# Patient Record
Sex: Female | Born: 1935 | Race: Black or African American | Hispanic: No | Marital: Married | Smoking: Never smoker
Health system: Southern US, Community
[De-identification: ages and names within clinical notes are randomized; demographics above are authoritative.]

## PROBLEM LIST (undated history)

## (undated) DIAGNOSIS — F32A Depression, unspecified: Secondary | ICD-10-CM

## (undated) DIAGNOSIS — E079 Disorder of thyroid, unspecified: Secondary | ICD-10-CM

## (undated) DIAGNOSIS — I10 Essential (primary) hypertension: Secondary | ICD-10-CM

## (undated) DIAGNOSIS — I2510 Atherosclerotic heart disease of native coronary artery without angina pectoris: Secondary | ICD-10-CM

## (undated) DIAGNOSIS — K219 Gastro-esophageal reflux disease without esophagitis: Secondary | ICD-10-CM

## (undated) DIAGNOSIS — F0280 Dementia in other diseases classified elsewhere without behavioral disturbance: Secondary | ICD-10-CM

## (undated) HISTORY — PX: FRACTURE SURGERY: SHX138

## (undated) HISTORY — DX: Atherosclerotic heart disease of native coronary artery without angina pectoris: I25.10

## (undated) HISTORY — PX: ABDOMINAL HYSTERECTOMY: SHX81

---

## 2009-05-06 ENCOUNTER — Emergency Department (HOSPITAL_BASED_OUTPATIENT_CLINIC_OR_DEPARTMENT_OTHER): Admission: EM | Admit: 2009-05-06 | Discharge: 2009-05-07 | Payer: Self-pay | Admitting: Emergency Medicine

## 2009-05-07 ENCOUNTER — Ambulatory Visit: Payer: Self-pay | Admitting: Diagnostic Radiology

## 2014-10-16 ENCOUNTER — Encounter (HOSPITAL_BASED_OUTPATIENT_CLINIC_OR_DEPARTMENT_OTHER): Payer: Self-pay

## 2014-10-16 ENCOUNTER — Emergency Department (HOSPITAL_BASED_OUTPATIENT_CLINIC_OR_DEPARTMENT_OTHER): Payer: Medicare HMO

## 2014-10-16 ENCOUNTER — Emergency Department (HOSPITAL_BASED_OUTPATIENT_CLINIC_OR_DEPARTMENT_OTHER)
Admission: EM | Admit: 2014-10-16 | Discharge: 2014-10-16 | Disposition: A | Discharge status: Home / Self Care | Payer: Medicare HMO | Attending: Emergency Medicine | Admitting: Emergency Medicine

## 2014-10-16 DIAGNOSIS — Z880 Allergy status to penicillin: Secondary | ICD-10-CM | POA: Diagnosis not present

## 2014-10-16 DIAGNOSIS — R197 Diarrhea, unspecified: Secondary | ICD-10-CM | POA: Insufficient documentation

## 2014-10-16 DIAGNOSIS — Z792 Long term (current) use of antibiotics: Secondary | ICD-10-CM | POA: Diagnosis not present

## 2014-10-16 DIAGNOSIS — Z8709 Personal history of other diseases of the respiratory system: Secondary | ICD-10-CM | POA: Insufficient documentation

## 2014-10-16 DIAGNOSIS — Z87440 Personal history of urinary (tract) infections: Secondary | ICD-10-CM | POA: Insufficient documentation

## 2014-10-16 DIAGNOSIS — R5383 Other fatigue: Secondary | ICD-10-CM | POA: Insufficient documentation

## 2014-10-16 DIAGNOSIS — Z791 Long term (current) use of non-steroidal anti-inflammatories (NSAID): Secondary | ICD-10-CM | POA: Diagnosis not present

## 2014-10-16 DIAGNOSIS — R531 Weakness: Secondary | ICD-10-CM | POA: Insufficient documentation

## 2014-10-16 DIAGNOSIS — Z79899 Other long term (current) drug therapy: Secondary | ICD-10-CM | POA: Diagnosis not present

## 2014-10-16 LAB — URINALYSIS, ROUTINE W REFLEX MICROSCOPIC
BILIRUBIN URINE: NEGATIVE
GLUCOSE, UA: NEGATIVE mg/dL
Hgb urine dipstick: NEGATIVE
KETONES UR: NEGATIVE mg/dL
Leukocytes, UA: NEGATIVE
Nitrite: NEGATIVE
Protein, ur: NEGATIVE mg/dL
SPECIFIC GRAVITY, URINE: 1.01 (ref 1.005–1.030)
UROBILINOGEN UA: 0.2 mg/dL (ref 0.0–1.0)
pH: 6 (ref 5.0–8.0)

## 2014-10-16 LAB — CBC WITH DIFFERENTIAL/PLATELET
Basophils Absolute: 0 10*3/uL (ref 0.0–0.1)
Basophils Relative: 1 % (ref 0–1)
EOS ABS: 0.2 10*3/uL (ref 0.0–0.7)
EOS PCT: 3 % (ref 0–5)
HCT: 35.1 % — ABNORMAL LOW (ref 36.0–46.0)
Hemoglobin: 11.5 g/dL — ABNORMAL LOW (ref 12.0–15.0)
LYMPHS ABS: 1.9 10*3/uL (ref 0.7–4.0)
Lymphocytes Relative: 25 % (ref 12–46)
MCH: 27.8 pg (ref 26.0–34.0)
MCHC: 32.8 g/dL (ref 30.0–36.0)
MCV: 85 fL (ref 78.0–100.0)
Monocytes Absolute: 1.3 10*3/uL — ABNORMAL HIGH (ref 0.1–1.0)
Monocytes Relative: 17 % — ABNORMAL HIGH (ref 3–12)
NEUTROS ABS: 4.1 10*3/uL (ref 1.7–7.7)
NEUTROS PCT: 54 % (ref 43–77)
PLATELETS: 191 10*3/uL (ref 150–400)
RBC: 4.13 MIL/uL (ref 3.87–5.11)
RDW: 13.5 % (ref 11.5–15.5)
WBC: 7.5 10*3/uL (ref 4.0–10.5)

## 2014-10-16 LAB — COMPREHENSIVE METABOLIC PANEL
ALBUMIN: 3.3 g/dL — AB (ref 3.5–5.0)
ALT: 17 U/L (ref 14–54)
AST: 33 U/L (ref 15–41)
Alkaline Phosphatase: 73 U/L (ref 38–126)
Anion gap: 10 (ref 5–15)
BUN: 17 mg/dL (ref 6–20)
CO2: 21 mmol/L — ABNORMAL LOW (ref 22–32)
Calcium: 9.6 mg/dL (ref 8.9–10.3)
Chloride: 103 mmol/L (ref 101–111)
Creatinine, Ser: 1.08 mg/dL — ABNORMAL HIGH (ref 0.44–1.00)
GFR calc non Af Amer: 47 mL/min — ABNORMAL LOW (ref >60)
GFR, EST AFRICAN AMERICAN: 55 mL/min — AB (ref >60)
Glucose, Bld: 119 mg/dL — ABNORMAL HIGH (ref 65–99)
POTASSIUM: 3.9 mmol/L (ref 3.5–5.1)
SODIUM: 134 mmol/L — AB (ref 135–145)
TOTAL PROTEIN: 7.6 g/dL (ref 6.5–8.1)
Total Bilirubin: 0.4 mg/dL (ref 0.3–1.2)

## 2014-10-16 LAB — TSH: TSH: 1.705 u[IU]/mL (ref 0.350–4.500)

## 2014-10-16 MED ORDER — SODIUM CHLORIDE 0.9 % IV BOLUS (SEPSIS)
500.0000 mL | Freq: Once | INTRAVENOUS | Status: AC
  Administered 2014-10-16: 500 mL via INTRAVENOUS

## 2014-10-16 NOTE — ED Notes (Signed)
Pt reports nausea and weakness for a few week. Sts she was [facility] 2 weeks [facility]. Seen at [facility] since, dx with URI, given prednisone, "hasn't gotten better."

## 2014-10-16 NOTE — Discharge Instructions (Signed)

## 2014-10-16 NOTE — ED Provider Notes (Signed)
CSN: [id number]  [id number] date & time 10/16/14  1541 History   First MD Initiated Contact with Patient 10/16/14 1554     Chief Complaint  Patient presents with  . Weakness     (Consider location/radiation/quality/duration/timing/severity/associated sxs/prior Treatment) Patient is a 81 y.o. female presenting with weakness. The history is provided by the patient.  Weakness Pertinent negatives include no chest pain, no abdominal pain, no headaches and no shortness of breath.   patient has had generalized weakness for the last month. States she was treated for a bronchitis/pneumonia and had been on antibodies. She then became constipated and had to be disimpacted at [facility]. She then followed up with her doctor was started on new N biotics. States she is allergic to most medicines. No fevers. States she's now having some diarrhea. States she feels overall weak and her husband has helped around the house. No chest pain. She has a mild cough. She states she feels primarily weak in her legs. No fevers or chills. States around a month ago her thyroid medicine was decreased also.  No past medical history on file. Past Surgical History  Procedure Laterality Date  . Abdominal hysterectomy    . Fracture surgery     No family history on file. History  Substance Use Topics  . Smoking status: Never Smoker   . Smokeless tobacco: Not on file  . Alcohol Use: No   OB History    No data available     Review of Systems  Constitutional: Positive for fatigue. Negative for activity change and appetite change.  Eyes: Negative for pain.  Respiratory: Negative for chest tightness and shortness of breath.   Cardiovascular: Negative for chest pain and leg swelling.  Gastrointestinal: Positive for diarrhea. Negative for nausea, vomiting and abdominal pain.  Genitourinary: Negative for flank pain.  Musculoskeletal: Negative for back pain and neck stiffness.  Skin: Negative for rash.   Neurological: Positive for weakness. Negative for numbness and headaches.  Psychiatric/Behavioral: Negative for behavioral problems.      Allergies  Actonel; Amlodipine; Codeine; Flexeril; Iodine; Lipitor; Losartan potassium-hctz; Penicillins; Singulair; Sulfa antibiotics; and Zafirlukast  Home Medications   Prior to Admission medications   Medication Sig Start Date End Date Taking? Authorizing Provider  azithromycin (ZITHROMAX) 250 MG tablet Take by mouth daily.   Yes Historical Provider, MD  ciclopirox (PENLAC) 8 % solution Apply topically at bedtime. Apply over nail and surrounding skin. Apply daily over previous coat. After seven (7) days, may remove with alcohol and continue cycle.   Yes Historical Provider, MD  DiltiaZEM HCl Coated Beads (CARTIA XT PO) Take by mouth.   Yes Historical Provider, MD  ezetimibe (ZETIA) 10 MG tablet Take 10 mg by mouth daily.   Yes Historical Provider, MD  fluconazole (DIFLUCAN) 150 MG tablet Take 150 mg by mouth daily.   Yes Historical Provider, MD  furosemide (LASIX) 40 MG tablet Take 40 mg by mouth.   Yes Historical Provider, MD  Ipratropium Bromide (ATROVENT IN) Inhale into the lungs.   Yes Historical Provider, MD  levothyroxine (SYNTHROID, LEVOTHROID) 50 MCG tablet Take 50 mcg by mouth daily before breakfast.   Yes Historical Provider, MD  metFORMIN (GLUCOPHAGE) 500 MG tablet Take by mouth 2 (two) times daily with a meal.   Yes Historical Provider, MD  montelukast (SINGULAIR) 10 MG tablet Take 10 mg by mouth at bedtime.   Yes Historical Provider, MD  mupirocin ointment (BACTROBAN) 2 % Place 1 application into  the nose 2 (two) times daily.   Yes Historical Provider, MD  naproxen (NAPROSYN) 500 MG tablet Take 500 mg by mouth 2 (two) times daily with a meal.   Yes Historical Provider, MD  nitrofurantoin (MACRODANTIN) 100 MG capsule Take 100 mg by mouth 4 (four) times daily.   Yes Historical Provider, MD  omeprazole (PRILOSEC) 40 MG capsule Take 40 mg  by mouth daily.   Yes Historical Provider, MD  promethazine-dextromethorphan (PROMETHAZINE-DM) 6.25-15 MG/5ML syrup Take by mouth 4 (four) times daily as needed for cough.   Yes Historical Provider, MD   BP 182/99 mmHg  Pulse 67  Temp(Src) 98.2 F (36.8 C) (Oral)  Resp 16  Ht 5\' 3"  (1.6 m)  Wt 167 lb (75.751 kg)  BMI 29.59 kg/m2  SpO2 100% Physical Exam  Constitutional: She is oriented to person, place, and time. She appears well-developed and well-nourished.  HENT:  Head: Normocephalic and atraumatic.  Eyes: Pupils are equal, round, and reactive to light.  Neck: Normal range of motion. Neck supple.  Cardiovascular: Normal rate, regular rhythm and normal heart sounds.   No murmur heard. Pulmonary/Chest: Effort normal and breath sounds normal. No respiratory distress. She has no wheezes. She has no rales.  Abdominal: Soft. Bowel sounds are normal. She exhibits no distension. There is no tenderness.  Musculoskeletal: Normal range of motion.  Neurological: She is alert and oriented to person, place, and time. No cranial nerve deficit.  Skin: Skin is warm and dry.  Psychiatric: Her speech is normal.  Nursing note and vitals reviewed.   ED Course  Procedures (including critical care time) Labs Review Labs Reviewed  COMPREHENSIVE METABOLIC PANEL - Abnormal; Notable for the following:    Sodium 134 (*)    CO2 21 (*)    Glucose, Bld 119 (*)    Creatinine, Ser 1.08 (*)    Albumin 3.3 (*)    GFR calc non Af Amer 47 (*)    GFR calc Af Amer 55 (*)    All other components within normal limits  CBC WITH DIFFERENTIAL/PLATELET - Abnormal; Notable for the following:    Hemoglobin 11.5 (*)    HCT 35.1 (*)    Monocytes Relative 17 (*)    Monocytes Absolute 1.3 (*)    All other components within normal limits  URINALYSIS, ROUTINE W REFLEX MICROSCOPIC (NOT AT [facility])  TSH    Imaging Review Dg Chest 2 View  10/16/2014   CLINICAL DATA:  Nausea, weakness for a week  EXAM: CHEST  2 VIEW   COMPARISON:  10/11/2014  FINDINGS: Cardiomediastinal silhouette is unremarkable. Stable scarring in right upper lobe. No acute infiltrate or pleural effusion. No pulmonary edema. Degenerative changes thoracic spine.  IMPRESSION: No active cardiopulmonary disease. Stable scarring in right upper lobe.   Electronically Signed   By: Natasha MeadLiviu  Pop M.D.   On: 10/16/2014 16:28     EKG Interpretation None      MDM   Final diagnoses:  Generalized weakness    Patient generalized weakness for the last month. Has had UTI and has been treated for pulmonary infection also. She states that her calcium has been low in the past is not low now. Urinalysis appears to cleared up. Cultures reviewed from outside hospital. TSH pending but is not resulted yet will be discharged home and will be followed up with her primary care doctor. Feels better after some IV fluid.    Benjiman CoreNathan Yesmin Mutch, MD 10/16/14 1816

## 2014-10-16 NOTE — ED Notes (Signed)
Reports URI  Recently, was seen by pvt MD>better.  Had some leg weakness last week and fell >pain rt hip.  Is able to stand,slowly, and able to walk.  Denies loss of consciousness  Alert and oriented

## 2019-06-01 ENCOUNTER — Ambulatory Visit: Payer: Medicare HMO | Attending: Internal Medicine

## 2019-06-01 DIAGNOSIS — Z23 Encounter for immunization: Secondary | ICD-10-CM | POA: Insufficient documentation

## 2019-06-01 NOTE — Progress Notes (Signed)
   Covid-19 Vaccination Clinic  Name:  Andrea Warner    MRN: [id number] DOB: [birth year]  06/01/2019  [name] was observed post Covid-19 immunization for 15 minutes without incident. She was provided with Vaccine Information Sheet and instruction to access the V-Safe system.   [name] was instructed to call 911 with any severe reactions post vaccine: Marland Kitchen Difficulty breathing  . Swelling of face and throat  . A fast heartbeat  . A bad rash all over body  . Dizziness and weakness   Immunizations Administered    Name Date Dose VIS Date Route   Pfizer COVID-19 Vaccine 06/01/2019  5:30 PM 0.3 mL 03/10/2019 Intramuscular   Manufacturer: [employer], [employer]   Lot: [id number]   NDC: [address]-0

## 2019-06-28 ENCOUNTER — Ambulatory Visit: Payer: Medicare HMO | Attending: Internal Medicine

## 2019-06-28 DIAGNOSIS — Z23 Encounter for immunization: Secondary | ICD-10-CM

## 2019-06-28 NOTE — Progress Notes (Signed)
   Covid-19 Vaccination Clinic  Name:  Sophiamarie Nease    MRN: [id number] DOB: December 15, 1931  06/28/2019  Ms. Tabares was observed post Covid-19 immunization for 15 minutes without incident. She was provided with Vaccine Information Sheet and instruction to access the V-Safe system.   Ms. Criado was instructed to call 911 with any severe reactions post vaccine: Marland Kitchen Difficulty breathing  . Swelling of face and throat  . A fast heartbeat  . A bad rash all over body  . Dizziness and weakness   Immunizations Administered    Name Date Dose VIS Date Route   Pfizer COVID-19 Vaccine 06/28/2019  4:29 PM 0.3 mL 03/10/2019 Intramuscular   Manufacturer: [employer], [employer]   Lot: [id number]   NDC: [address]-1

## 2019-09-16 ENCOUNTER — Inpatient Hospital Stay (HOSPITAL_BASED_OUTPATIENT_CLINIC_OR_DEPARTMENT_OTHER)
Admission: EM | Admit: 2019-09-16 | Discharge: 2019-09-19 | DRG: 069 | Disposition: A | Discharge status: Home Health | Payer: Medicare HMO | Attending: Internal Medicine | Admitting: Internal Medicine

## 2019-09-16 ENCOUNTER — Emergency Department (HOSPITAL_BASED_OUTPATIENT_CLINIC_OR_DEPARTMENT_OTHER): Payer: Medicare HMO

## 2019-09-16 ENCOUNTER — Encounter (HOSPITAL_BASED_OUTPATIENT_CLINIC_OR_DEPARTMENT_OTHER): Payer: Self-pay | Admitting: Emergency Medicine

## 2019-09-16 DIAGNOSIS — R8281 Pyuria: Secondary | ICD-10-CM | POA: Diagnosis present

## 2019-09-16 DIAGNOSIS — R471 Dysarthria and anarthria: Secondary | ICD-10-CM | POA: Diagnosis present

## 2019-09-16 DIAGNOSIS — R8271 Bacteriuria: Secondary | ICD-10-CM | POA: Diagnosis present

## 2019-09-16 DIAGNOSIS — Z880 Allergy status to penicillin: Secondary | ICD-10-CM

## 2019-09-16 DIAGNOSIS — R4182 Altered mental status, unspecified: Secondary | ICD-10-CM | POA: Diagnosis not present

## 2019-09-16 DIAGNOSIS — R419 Unspecified symptoms and signs involving cognitive functions and awareness: Secondary | ICD-10-CM | POA: Diagnosis present

## 2019-09-16 DIAGNOSIS — Z8249 Family history of ischemic heart disease and other diseases of the circulatory system: Secondary | ICD-10-CM

## 2019-09-16 DIAGNOSIS — E1159 Type 2 diabetes mellitus with other circulatory complications: Secondary | ICD-10-CM

## 2019-09-16 DIAGNOSIS — Z66 Do not resuscitate: Secondary | ICD-10-CM | POA: Diagnosis present

## 2019-09-16 DIAGNOSIS — R4701 Aphasia: Secondary | ICD-10-CM | POA: Diagnosis present

## 2019-09-16 DIAGNOSIS — Z91041 Radiographic dye allergy status: Secondary | ICD-10-CM

## 2019-09-16 DIAGNOSIS — E039 Hypothyroidism, unspecified: Secondary | ICD-10-CM

## 2019-09-16 DIAGNOSIS — R262 Difficulty in walking, not elsewhere classified: Secondary | ICD-10-CM | POA: Diagnosis not present

## 2019-09-16 DIAGNOSIS — G8324 Monoplegia of upper limb affecting left nondominant side: Secondary | ICD-10-CM | POA: Diagnosis present

## 2019-09-16 DIAGNOSIS — R4702 Dysphasia: Secondary | ICD-10-CM | POA: Diagnosis present

## 2019-09-16 DIAGNOSIS — M6281 Muscle weakness (generalized): Secondary | ICD-10-CM | POA: Diagnosis not present

## 2019-09-16 DIAGNOSIS — I959 Hypotension, unspecified: Secondary | ICD-10-CM | POA: Diagnosis not present

## 2019-09-16 DIAGNOSIS — N179 Acute kidney failure, unspecified: Secondary | ICD-10-CM

## 2019-09-16 DIAGNOSIS — F329 Major depressive disorder, single episode, unspecified: Secondary | ICD-10-CM | POA: Diagnosis present

## 2019-09-16 DIAGNOSIS — R29701 NIHSS score 1: Secondary | ICD-10-CM | POA: Diagnosis present

## 2019-09-16 DIAGNOSIS — G309 Alzheimer's disease, unspecified: Secondary | ICD-10-CM | POA: Diagnosis present

## 2019-09-16 DIAGNOSIS — I351 Nonrheumatic aortic (valve) insufficiency: Secondary | ICD-10-CM | POA: Diagnosis not present

## 2019-09-16 DIAGNOSIS — R9431 Abnormal electrocardiogram [ECG] [EKG]: Secondary | ICD-10-CM | POA: Diagnosis not present

## 2019-09-16 DIAGNOSIS — E119 Type 2 diabetes mellitus without complications: Secondary | ICD-10-CM | POA: Diagnosis not present

## 2019-09-16 DIAGNOSIS — I5040 Unspecified combined systolic (congestive) and diastolic (congestive) heart failure: Secondary | ICD-10-CM | POA: Diagnosis present

## 2019-09-16 DIAGNOSIS — Z79890 Hormone replacement therapy (postmenopausal): Secondary | ICD-10-CM

## 2019-09-16 DIAGNOSIS — I639 Cerebral infarction, unspecified: Secondary | ICD-10-CM

## 2019-09-16 DIAGNOSIS — I130 Hypertensive heart and chronic kidney disease with heart failure and stage 1 through stage 4 chronic kidney disease, or unspecified chronic kidney disease: Secondary | ICD-10-CM | POA: Diagnosis not present

## 2019-09-16 DIAGNOSIS — G459 Transient cerebral ischemic attack, unspecified: Principal | ICD-10-CM | POA: Diagnosis present

## 2019-09-16 DIAGNOSIS — Z23 Encounter for immunization: Secondary | ICD-10-CM | POA: Diagnosis not present

## 2019-09-16 DIAGNOSIS — Z885 Allergy status to narcotic agent status: Secondary | ICD-10-CM

## 2019-09-16 DIAGNOSIS — F0280 Dementia in other diseases classified elsewhere without behavioral disturbance: Secondary | ICD-10-CM

## 2019-09-16 DIAGNOSIS — I152 Hypertension secondary to endocrine disorders: Secondary | ICD-10-CM | POA: Diagnosis present

## 2019-09-16 DIAGNOSIS — D649 Anemia, unspecified: Secondary | ICD-10-CM

## 2019-09-16 DIAGNOSIS — N390 Urinary tract infection, site not specified: Secondary | ICD-10-CM | POA: Diagnosis not present

## 2019-09-16 DIAGNOSIS — Z882 Allergy status to sulfonamides status: Secondary | ICD-10-CM

## 2019-09-16 DIAGNOSIS — D72829 Elevated white blood cell count, unspecified: Secondary | ICD-10-CM | POA: Diagnosis not present

## 2019-09-16 DIAGNOSIS — I340 Nonrheumatic mitral (valve) insufficiency: Secondary | ICD-10-CM | POA: Diagnosis not present

## 2019-09-16 DIAGNOSIS — Z79899 Other long term (current) drug therapy: Secondary | ICD-10-CM | POA: Diagnosis not present

## 2019-09-16 DIAGNOSIS — Z7984 Long term (current) use of oral hypoglycemic drugs: Secondary | ICD-10-CM

## 2019-09-16 DIAGNOSIS — R778 Other specified abnormalities of plasma proteins: Secondary | ICD-10-CM | POA: Diagnosis not present

## 2019-09-16 DIAGNOSIS — Z90710 Acquired absence of both cervix and uterus: Secondary | ICD-10-CM

## 2019-09-16 DIAGNOSIS — Z20822 Contact with and (suspected) exposure to covid-19: Secondary | ICD-10-CM | POA: Diagnosis present

## 2019-09-16 DIAGNOSIS — E1169 Type 2 diabetes mellitus with other specified complication: Secondary | ICD-10-CM | POA: Diagnosis present

## 2019-09-16 DIAGNOSIS — R4189 Other symptoms and signs involving cognitive functions and awareness: Secondary | ICD-10-CM | POA: Diagnosis not present

## 2019-09-16 DIAGNOSIS — Z7982 Long term (current) use of aspirin: Secondary | ICD-10-CM | POA: Diagnosis not present

## 2019-09-16 DIAGNOSIS — Z888 Allergy status to other drugs, medicaments and biological substances status: Secondary | ICD-10-CM

## 2019-09-16 DIAGNOSIS — E1122 Type 2 diabetes mellitus with diabetic chronic kidney disease: Secondary | ICD-10-CM | POA: Diagnosis not present

## 2019-09-16 DIAGNOSIS — I6782 Cerebral ischemia: Secondary | ICD-10-CM | POA: Diagnosis not present

## 2019-09-16 DIAGNOSIS — R4781 Slurred speech: Secondary | ICD-10-CM | POA: Diagnosis not present

## 2019-09-16 DIAGNOSIS — R001 Bradycardia, unspecified: Secondary | ICD-10-CM | POA: Diagnosis present

## 2019-09-16 DIAGNOSIS — I6340 Cerebral infarction due to embolism of unspecified cerebral artery: Secondary | ICD-10-CM | POA: Insufficient documentation

## 2019-09-16 DIAGNOSIS — E860 Dehydration: Secondary | ICD-10-CM | POA: Diagnosis present

## 2019-09-16 DIAGNOSIS — I509 Heart failure, unspecified: Secondary | ICD-10-CM | POA: Diagnosis not present

## 2019-09-16 DIAGNOSIS — N183 Chronic kidney disease, stage 3 unspecified: Secondary | ICD-10-CM | POA: Diagnosis not present

## 2019-09-16 DIAGNOSIS — E785 Hyperlipidemia, unspecified: Secondary | ICD-10-CM | POA: Diagnosis present

## 2019-09-16 DIAGNOSIS — K219 Gastro-esophageal reflux disease without esophagitis: Secondary | ICD-10-CM | POA: Diagnosis present

## 2019-09-16 HISTORY — DX: Depression, unspecified: F32.A

## 2019-09-16 HISTORY — DX: Dementia in other diseases classified elsewhere, unspecified severity, without behavioral disturbance, psychotic disturbance, mood disturbance, and anxiety: F02.80

## 2019-09-16 HISTORY — DX: Gastro-esophageal reflux disease without esophagitis: K21.9

## 2019-09-16 HISTORY — DX: Disorder of thyroid, unspecified: E07.9

## 2019-09-16 HISTORY — DX: Essential (primary) hypertension: I10

## 2019-09-16 LAB — CBC WITH DIFFERENTIAL/PLATELET
Abs Immature Granulocytes: 0.7 10*3/uL — ABNORMAL HIGH (ref 0.00–0.07)
Basophils Absolute: 0.2 10*3/uL — ABNORMAL HIGH (ref 0.0–0.1)
Basophils Relative: 2 %
Eosinophils Absolute: 0.6 10*3/uL — ABNORMAL HIGH (ref 0.0–0.5)
Eosinophils Relative: 6 %
HCT: 28.1 % — ABNORMAL LOW (ref 36.0–46.0)
Hemoglobin: 9.1 g/dL — ABNORMAL LOW (ref 12.0–15.0)
Immature Granulocytes: 7 %
Lymphocytes Relative: 18 %
Lymphs Abs: 1.9 10*3/uL (ref 0.7–4.0)
MCH: 28.5 pg (ref 26.0–34.0)
MCHC: 32.4 g/dL (ref 30.0–36.0)
MCV: 88.1 fL (ref 80.0–100.0)
Monocytes Absolute: 1.2 10*3/uL — ABNORMAL HIGH (ref 0.1–1.0)
Monocytes Relative: 12 %
Neutro Abs: 5.8 10*3/uL (ref 1.7–7.7)
Neutrophils Relative %: 55 %
Platelets: 224 10*3/uL (ref 150–400)
RBC: 3.19 MIL/uL — ABNORMAL LOW (ref 3.87–5.11)
RDW: 15.5 % (ref 11.5–15.5)
Smear Review: NORMAL
WBC: 10.5 10*3/uL (ref 4.0–10.5)
nRBC: 0.2 % (ref 0.0–0.2)

## 2019-09-16 LAB — COMPREHENSIVE METABOLIC PANEL
ALT: 10 U/L (ref 0–44)
AST: 20 U/L (ref 15–41)
Albumin: 3 g/dL — ABNORMAL LOW (ref 3.5–5.0)
Alkaline Phosphatase: 62 U/L (ref 38–126)
Anion gap: 10 (ref 5–15)
BUN: 22 mg/dL (ref 8–23)
CO2: 22 mmol/L (ref 22–32)
Calcium: 9.1 mg/dL (ref 8.9–10.3)
Chloride: 101 mmol/L (ref 98–111)
Creatinine, Ser: 1.73 mg/dL — ABNORMAL HIGH (ref 0.44–1.00)
GFR calc Af Amer: 31 mL/min — ABNORMAL LOW (ref >60)
GFR calc non Af Amer: 26 mL/min — ABNORMAL LOW (ref >60)
Glucose, Bld: 101 mg/dL — ABNORMAL HIGH (ref 70–99)
Potassium: 3.6 mmol/L (ref 3.5–5.1)
Sodium: 133 mmol/L — ABNORMAL LOW (ref 135–145)
Total Bilirubin: 0.7 mg/dL (ref 0.3–1.2)
Total Protein: 7.1 g/dL (ref 6.5–8.1)

## 2019-09-16 LAB — URINALYSIS, ROUTINE W REFLEX MICROSCOPIC
Bilirubin Urine: NEGATIVE
Glucose, UA: NEGATIVE mg/dL
Hgb urine dipstick: NEGATIVE
Ketones, ur: NEGATIVE mg/dL
Nitrite: NEGATIVE
Protein, ur: NEGATIVE mg/dL
Specific Gravity, Urine: 1.015 (ref 1.005–1.030)
pH: 6 (ref 5.0–8.0)

## 2019-09-16 LAB — CBG MONITORING, ED: Glucose-Capillary: 90 mg/dL (ref 70–99)

## 2019-09-16 LAB — RAPID URINE DRUG SCREEN, HOSP PERFORMED
Amphetamines: NOT DETECTED
Barbiturates: NOT DETECTED
Benzodiazepines: NOT DETECTED
Cocaine: NOT DETECTED
Opiates: NOT DETECTED
Tetrahydrocannabinol: NOT DETECTED

## 2019-09-16 LAB — SARS CORONAVIRUS 2 BY RT PCR (HOSPITAL ORDER, PERFORMED IN ~~LOC~~ HOSPITAL LAB): SARS Coronavirus 2: NEGATIVE

## 2019-09-16 LAB — URINALYSIS, MICROSCOPIC (REFLEX): RBC / HPF: NONE SEEN RBC/hpf (ref 0–5)

## 2019-09-16 LAB — APTT: aPTT: 31 seconds (ref 24–36)

## 2019-09-16 LAB — ETHANOL: Alcohol, Ethyl (B): <10 mg/dL (ref <10)

## 2019-09-16 LAB — PROTIME-INR
INR: 1.2 (ref 0.8–1.2)
Prothrombin Time: 14.3 seconds (ref 11.4–15.2)

## 2019-09-16 LAB — OCCULT BLOOD X 1 CARD TO LAB, STOOL: Fecal Occult Bld: NEGATIVE

## 2019-09-16 MED ORDER — ACETAMINOPHEN 325 MG PO TABS: 650.0000 mg | ORAL_TABLET | ORAL | Status: DC | PRN

## 2019-09-16 MED ORDER — STROKE: EARLY STAGES OF RECOVERY BOOK
Freq: Once | Status: AC
  Filled 2019-09-16: qty 1

## 2019-09-16 MED ORDER — HEPARIN SODIUM (PORCINE) 5000 UNIT/ML IJ SOLN
5000.0000 [IU] | Freq: Three times a day (TID) | INTRAMUSCULAR | Status: DC
  Administered 2019-09-16 – 2019-09-19 (×8): 5000 [IU] via SUBCUTANEOUS
  Filled 2019-09-16 (×8): qty 1

## 2019-09-16 MED ORDER — SENNOSIDES-DOCUSATE SODIUM 8.6-50 MG PO TABS: 1.0000 | ORAL_TABLET | Freq: Every evening | ORAL | Status: DC | PRN

## 2019-09-16 MED ORDER — ACETAMINOPHEN 160 MG/5ML PO SOLN: 650.0000 mg | ORAL | Status: DC | PRN

## 2019-09-16 MED ORDER — DONEPEZIL HCL 5 MG PO TABS
5.0000 mg | ORAL_TABLET | Freq: Every day | ORAL | Status: DC
  Administered 2019-09-17: 5 mg via ORAL
  Filled 2019-09-16: qty 1

## 2019-09-16 MED ORDER — ACETAMINOPHEN 650 MG RE SUPP: 650.0000 mg | RECTAL | Status: DC | PRN

## 2019-09-16 MED ORDER — PRAVASTATIN SODIUM 10 MG PO TABS
20.0000 mg | ORAL_TABLET | Freq: Every day | ORAL | Status: DC
  Administered 2019-09-17 – 2019-09-18 (×3): 20 mg via ORAL
  Filled 2019-09-16 (×2): qty 2

## 2019-09-16 MED ORDER — ASPIRIN 325 MG PO TABS
325.0000 mg | ORAL_TABLET | Freq: Once | ORAL | Status: AC
  Administered 2019-09-16: 325 mg via ORAL
  Filled 2019-09-16: qty 1

## 2019-09-16 MED ORDER — SODIUM CHLORIDE 0.9 % IV SOLN: INTRAVENOUS | Status: DC

## 2019-09-16 MED ORDER — POTASSIUM CHLORIDE 10 MEQ/100ML IV SOLN
10.0000 meq | INTRAVENOUS | Status: AC
  Administered 2019-09-16 – 2019-09-17 (×2): 10 meq via INTRAVENOUS
  Filled 2019-09-16 (×2): qty 100

## 2019-09-16 MED ORDER — ASPIRIN EC 81 MG PO TBEC
81.0000 mg | DELAYED_RELEASE_TABLET | Freq: Every day | ORAL | Status: DC
  Administered 2019-09-17 – 2019-09-19 (×3): 81 mg via ORAL
  Filled 2019-09-16 (×3): qty 1

## 2019-09-16 MED ORDER — ASPIRIN 300 MG RE SUPP: 300.0000 mg | Freq: Once | RECTAL | Status: AC

## 2019-09-16 MED ORDER — LEVOTHYROXINE SODIUM 75 MCG PO TABS
75.0000 ug | ORAL_TABLET | Freq: Every day | ORAL | Status: DC
  Administered 2019-09-17: 75 ug via ORAL
  Filled 2019-09-16: qty 1

## 2019-09-16 NOTE — ED Notes (Signed)
ED Provider at bedside. 

## 2019-09-16 NOTE — ED Notes (Signed)
ED Provider at bedside to collect occult blood stool sample.

## 2019-09-16 NOTE — ED Notes (Addendum)
ED Provider at bedside. At 1855 pt was sleeping, RN woke pt up to assess prior to transport. Pt noted to have increased difficulty getting words out compared to earlier assessments. Dr Charm Barges notified and at bedside to evaluate. Tele-neuro contacted and Dr. Charm Barges spoke to RN regarding pt's symptoms

## 2019-09-16 NOTE — Consult Note (Signed)
[facility] Tele[facility]urology Consult Services   Date of Service:   09/16/2019 14:06:07  Impression:     .  R47.81 - Slurred speech  Comments/Sign-Out: Andrea Warner is 86 yo F w/ a hx of HTN, hypothyroidism who presents with slurred speech. LKN at [facility] on 6/28. Today, was noted by family to have slurred speech and some word finding difficulty. Here symptoms have been fluctuating. Mild word finding difficulties on my exam but no frank dysarthria. No focal weakness or numbness. Head CT showed no acute intracranial pathology. Presentation concerning for mild ischemic stroke. Cannot exclude a metabolic encephalopathy. Recommend the following:  - MRI brain w/o contrast  - ASA 325 mg once  - goal BP is normotension  - if acute infarct, recommend vessel imaging, TTE, telemetry, lipid panel, and hemoglobin A1c  - Neuology to follow  Metrics: Last Known Well: 09/15/2019 16:00:00 [facility] Notification Time: 09/16/2019 14:06:07 Arrival Time: 09/16/2019 13:04:00 Stamp Time: 09/16/2019 14:06:07 Time First Login Attempt: 09/16/2019 14:10:14 Symptoms: slurred speech NIHSS Start Assessment Time: 09/16/2019 14:18:07 Patient is not a candidate for Alteplase/Activase. Alteplase Medical Decision: 09/16/2019 14:18:40 Patient was not deemed candidate for Alteplase/Activase thrombolytics because of following reasons: Last Well Known Above 4.5 Hours.  CT head showed no acute hemorrhage or acute core infarct.  ED Physician notified of diagnostic impression and management plan on 09/16/2019 14:44:00  Advanced Imaging: Advanced Imaging Not Recommended because:  Clinical Presentation is not Suggestive of LVO and NIHSS is <6   Our recommendations are outlined below.  Recommendations:     .  Activate Stroke Protocol Admission/Order Set     .  Stroke/Telemetry Floor     .  Neuro Checks     .  Bedside Swallow Eval     .  DVT Prophylaxis     .  IV Fluids, Normal Saline     .  Head of Bed 30  Degrees     .  Euglycemia and Avoid Hyperthermia (PRN Acetaminophen)     .  Antiplatelet Therapy Recommended  Routine Consultation with [facility] for Follow up Care  Sign Out:     .  Discussed with Emergency Department Provider    ------------------------------------------------------------------------------  History of Present Illness: Patient is a 86 year old Female.  Patient was brought by private transportation with symptoms of slurred speech  Andrea Warner is 86 yo F w/ a hx of HTN, hypothyroidism who presents with slurred speech. LKN at [facility] on 6/28. Today, was noted by family to have slurred speech and some word finding difficulty. Here symptoms have been fluctuating. Mild word finding difficulties on my exam but no frank dysarthria. No focal weakness or numbness. Head CT showed no acute intracranial pathology.  Last seen normal was beyond 4.5 hours of presentation. There is no history of hemorrhagic complications or intracranial hemorrhage. There is no history of Recent Anticoagulants. There is no history of recent major surgery. There is no history of recent stroke.  Past Medical History:     . Hypertension  Anticoagulant use:  No  Antiplatelet use: No    Examination: BP(123/45), Pulse(64), Blood Glucose(101) 1A: Level of Consciousness - Alert; keenly responsive + 0 1B: Ask Month and Age - Both Questions Right + 0 1C: Blink Eyes & Squeeze Hands - Performs Both Tasks + 0 2: Test Horizontal Extraocular Movements - Normal + 0 3: Test Visual Fields - No Visual Loss + 0 4: Test Facial Palsy (Use Grimace if Obtunded) - Normal symmetry + 0 5A:  Test Left Arm Motor Drift - No Drift for 10 Seconds + 0 5B: Test Right Arm Motor Drift - No Drift for 10 Seconds + 0 6A: Test Left Leg Motor Drift - No Drift for 5 Seconds + 0 6B: Test Right Leg Motor Drift - No Drift for 5 Seconds + 0 7: Test Limb Ataxia (FNF/Heel-Shin) - No Ataxia + 0 8: Test Sensation - Normal; No  sensory loss + 0 9: Test Language/Aphasia - Mild-Moderate Aphasia: Some Obvious Changes, Without Significant Limitation + 1 10: Test Dysarthria - Normal + 0 11: Test Extinction/Inattention - No abnormality + 0  NIHSS Score: 1  Head CT: No acute intracranial pathology  Pre-Morbid Modified Ranking Scale: Unable to assess   Patient/Family was informed the Neurology Consult would occur via TeleHealth consult by way of interactive audio and video telecommunications and consented to receiving care in this manner.   Patient is being evaluated for possible acute neurologic impairment and high probability of imminent or life-threatening deterioration. I spent total of 30 minutes providing care to this patient, including time for face to face visit via telemedicine, review of medical records, imaging studies and discussion of findings with providers, the patient and/or family.   Dr Vicente Masson   [facility] [phone]  Case [id number]

## 2019-09-16 NOTE — ED Notes (Signed)
Pt's son Deniece Portela given update on pt status and room assignment with unit phone number at [facility]

## 2019-09-16 NOTE — Progress Notes (Signed)
Notified by Luisa Hart in [employer] that patient is now in junctional rhythm. HR 49. On call MD notified.

## 2019-09-16 NOTE — ED Triage Notes (Addendum)
Slurred speech since waking at 7am. L shoulder pain x a few weeks. No weakness noted.

## 2019-09-16 NOTE — ED Provider Notes (Signed)
Signout from Dr. Dalene Seltzer.  86 year old female with strokelike symptoms last known well 4 PM yesterday.  No TPA given as patient outside window.  She is pending transfer to [facility] for admission and neurologic work-up. Physical Exam  BP (!) 124/51   Pulse (!) 43   Temp 99.5 F (37.5 C) (Oral)   Resp 14   Wt 51.7 kg   SpO2 100%   BMI 20.19 kg/m   Physical Exam  ED Course/Procedures     Procedures  MDM  7:10 PM.  Called to bedside as patient has recurrence of her difficulty with speech.  The rest of her cranial nerve exam was normal and her strength is preserved upper and lower extremities.  Reached out to teleneurology and the stroke nurse called Dr. Lilian Kapur who had examined her before.  They still feel that the patient is outside the window and they would not recommend TPA.  We will continue to monitor patient's status and transfer to [facility] when transport is available.       Terrilee Files, MD 09/17/19 1057

## 2019-09-16 NOTE — H&P (Signed)
History and Physical    Andrea Warner VFI:[id number] DOB: [birth year] DOA: 09/16/2019  PCP: Patient, No Pcp Per  Patient coming from: [facility] ED  I have personally briefly reviewed patient's old medical records in [facility]  Chief Complaint: Slurred speech  HPI: Andrea Warner is a 86 y.o. female with medical history significant for Alzheimer dementia, type 2 diabetes, hypertension, hyperlipidemia, hypothyroidism, and depression who presents to the ED for evaluation of slurred speech.  History is somewhat limited from patient due to the expressive aphasia and is otherwise supplemented by chart review.  Patient was last known normal around 4 PM on 09/15/2019.  She says when she woke this morning she felt her usual self.  When family saw her today around 46 PM she was noted to have slurred speech and difficulty finding words.  Symptoms were reported to be waxing and waning.  She did not have any focal weakness, difficulty with ambulation, change in sensation, facial droop, visual changes.  She denies any headache, chest pain, dyspnea, nausea, vomiting, abdominal pain, dysuria, diarrhea.  Initially during my evaluation patient speech was fluent and she felt it was back to her usual self however mid conversation she began to have expressive aphasia again.  [facility] ED Course:  Initial vitals showed BP 141/48, pulse 51, RR 16, temp 99.5 Fahrenheit, SPO2 100% on room air.  Labs are notable for WBC 10.5, hemoglobin 9.1 (12.3 on 06/07/2019), platelets 224,000, sodium 133, potassium 3.6, bicarb 22, BUN 22, creatinine 1.73 (0.96 on 06/07/2019), serum glucose 101, LFTs within normal limits, serum ethanol level undetectable.  UDS is negative.  Urinalysis shows negative nitrites, large leukocytes, no RBC/hpf, 21-WBC/hpf, many bacteria on microscopy.  FOBT is negative.  SARS-CoV-2 PCR is negative.  CT head without contrast is negative for acute intracranial abnormality.  Chronic  white matter changes are noted.  Telemetry neurology were consulted and recommended admission for ischemic stroke versus metabolic encephalopathy work-up.  The hospitalist service was consulted to transfer admit for further evaluation and management.  Review of Systems:  All systems reviewed and are negative except as documented in history of present illness above.   Past Medical History:  Diagnosis Date  . Alzheimer's dementia (HCC)   . Depression   . GERD (gastroesophageal reflux disease)   . Hypertension   . Thyroid disease     Past Surgical History:  Procedure Laterality Date  . ABDOMINAL HYSTERECTOMY    . FRACTURE SURGERY      Social History:  reports that she has never smoked. She has never used smokeless tobacco. She reports that she does not drink alcohol. No history on file for drug use.  Allergies  Allergen Reactions  . Actonel [Risedronate Sodium]   . Amlodipine   . Codeine   . Flexeril [Cyclobenzaprine]   . Iodine   . Lipitor [Atorvastatin]   . Losartan Potassium-Hctz   . Penicillins   . Singulair [Montelukast Sodium]   . Sulfa Antibiotics   . Zafirlukast     Family History  Problem Relation Age of Onset  . Hypertension Father      Prior to Admission medications   Medication Sig Start Date End Date Taking? Authorizing Provider  azithromycin (ZITHROMAX) 250 MG tablet Take by mouth daily.    [provider]  ciclopirox (PENLAC) 8 % solution Apply topically at bedtime. Apply over nail and surrounding skin. Apply daily over previous coat. After seven (7) days, may remove with alcohol and continue  cycle.    [provider]  DiltiaZEM HCl Coated Beads (CARTIA XT PO) Take by mouth.    [provider]  ezetimibe (ZETIA) 10 MG tablet Take 10 mg by mouth daily.    [provider]  fluconazole (DIFLUCAN) 150 MG tablet Take 150 mg by mouth daily.    [provider]  furosemide (LASIX) 40 MG tablet Take 40 mg by mouth.     [provider]  Ipratropium Bromide (ATROVENT IN) Inhale into the lungs.    [provider]  levothyroxine (SYNTHROID, LEVOTHROID) 50 MCG tablet Take 50 mcg by mouth daily before breakfast.    [provider]  metFORMIN (GLUCOPHAGE) 500 MG tablet Take by mouth 2 (two) times daily with a meal.    [provider]  montelukast (SINGULAIR) 10 MG tablet Take 10 mg by mouth at bedtime.    [provider]  mupirocin ointment (BACTROBAN) 2 % Place 1 application into the nose 2 (two) times daily.    [provider]  naproxen (NAPROSYN) 500 MG tablet Take 500 mg by mouth 2 (two) times daily with a meal.    [provider]  nitrofurantoin (MACRODANTIN) 100 MG capsule Take 100 mg by mouth 4 (four) times daily.    [provider]  omeprazole (PRILOSEC) 40 MG capsule Take 40 mg by mouth daily.    [provider]  promethazine-dextromethorphan (PROMETHAZINE-DM) 6.25-15 MG/5ML syrup Take by mouth 4 (four) times daily as needed for cough.    [provider]    Physical Exam: Vitals:   09/16/19 1900 09/16/19 1912 09/16/19 1931 09/16/19 2137  BP: (!) 126/50   (!) 112/54  Pulse:  (!) 51  (!) 53  Resp: 17 14  14   Temp:   97.8 F (36.6 C) 98 F (36.7 C)  TempSrc:   Oral Oral  SpO2:  100%  100%  Weight:       Constitutional: Thin elderly woman resting supine in bed, NAD, calm, comfortable Eyes: PERRL, EOMI limited due to cooperation?  Lids and conjunctivae normal ENMT: Mucous membranes are dry. Posterior pharynx clear of any exudate or lesions.Normal dentition.  Neck: normal, supple, no masses. Respiratory: clear to auscultation bilaterally, no wheezing, no crackles. Normal respiratory effort. No accessory muscle use.  Cardiovascular: Bradycardic, no murmurs / rubs / gallops. No extremity edema. 2+ pedal pulses. Abdomen: no tenderness, no masses palpated. No hepatosplenomegaly. Bowel sounds positive.    Musculoskeletal: no clubbing / cyanosis. No joint deformity upper and lower extremities. Good ROM, no contractures. Normal muscle tone.  Skin: no rashes, lesions, ulcers. No induration Neurologic: Speech initially fluent but then returned to expressive aphasia otherwise CN 2-12 grossly intact. Sensation intact, Strength 5/5 in all 4.  Psychiatric: Normal judgment and insight. Alert and oriented x 3 initially until expressive aphasia recurred.   Labs on Admission: I have personally reviewed following labs and imaging studies  CBC: Recent Labs  Lab 09/16/19 1407  WBC 10.5  NEUTROABS 5.8  HGB 9.1*  HCT 28.1*  MCV 88.1  PLT 224   Basic Metabolic Panel: Recent Labs  Lab 09/16/19 1407  NA 133*  K 3.6  CL 101  CO2 22  GLUCOSE 101*  BUN 22  CREATININE 1.73*  CALCIUM 9.1   GFR: CrCl cannot be calculated (Unknown ideal weight.). Liver Function Tests: Recent Labs  Lab 09/16/19 1407  AST 20  ALT 10  ALKPHOS 62  BILITOT 0.7  PROT 7.1  ALBUMIN 3.0*  No results for input(s): LIPASE, AMYLASE in the last 168 hours. No results for input(s): AMMONIA in the last 168 hours. Coagulation Profile: Recent Labs  Lab 09/16/19 1407  INR 1.2   Cardiac Enzymes: No results for input(s): CKTOTAL, CKMB, CKMBINDEX, TROPONINI in the last 168 hours. BNP (last 3 results) No results for input(s): PROBNP in the last 8760 hours. HbA1C: No results for input(s): HGBA1C in the last 72 hours. CBG: Recent Labs  Lab 09/16/19 1316  GLUCAP 90   Lipid Profile: No results for input(s): CHOL, HDL, LDLCALC, TRIG, CHOLHDL, LDLDIRECT in the last 72 hours. Thyroid Function Tests: No results for input(s): TSH, T4TOTAL, FREET4, T3FREE, THYROIDAB in the last 72 hours. Anemia Panel: No results for input(s): VITAMINB12, FOLATE, FERRITIN, TIBC, IRON, RETICCTPCT in the last 72 hours. Urine analysis:    Component Value Date/Time   COLORURINE YELLOW 09/16/2019 1541   APPEARANCEUR HAZY (A) 09/16/2019  1541   LABSPEC 1.015 09/16/2019 1541   PHURINE 6.0 09/16/2019 1541   GLUCOSEU NEGATIVE 09/16/2019 1541   HGBUR NEGATIVE 09/16/2019 1541   BILIRUBINUR NEGATIVE 09/16/2019 1541   KETONESUR NEGATIVE 09/16/2019 1541   PROTEINUR NEGATIVE 09/16/2019 1541   UROBILINOGEN 0.2 10/16/2014 1600   NITRITE NEGATIVE 09/16/2019 1541   LEUKOCYTESUR LARGE (A) 09/16/2019 1541    Radiological Exams on Admission: CT Head Wo Contrast  Result Date: 09/16/2019 CLINICAL DATA:  Slurred speech since this morning. Left shoulder pain for a few weeks. EXAM: CT HEAD WITHOUT CONTRAST TECHNIQUE: Contiguous axial images were obtained from the base of the skull through the vertex without intravenous contrast. COMPARISON:  November 24, 2017 FINDINGS: Brain: No subdural, epidural, or subarachnoid hemorrhage. Ventricles and sulci are prominent but stable. Chronic white matter changes are identified. No acute cortical ischemia or infarct. No mass effect or midline shift. Cerebellum, brainstem, and basal cisterns are normal. Vascular: Calcified atherosclerosis is seen in the intracranial carotids. Skull: Normal. Negative for fracture or focal lesion. Sinuses/Orbits: No acute finding. Other: None. IMPRESSION: No acute intracranial abnormalities identified. Chronic white matter changes. Electronically Signed   By: Dorise Bullion III M.D   On: 09/16/2019 14:38    EKG: Independently reviewed.  Sinus bradycardia, rate 45 with PVC, low voltage, no prior for comparison.  Assessment/Plan Principal Problem:   Slurred speech Active Problems:   Alzheimer's dementia ([place])   Type 2 diabetes mellitus (HCC)   Normocytic anemia   AKI (acute kidney injury) ([place])   Hypertension associated with diabetes ([place])   Hyperlipidemia associated with type 2 diabetes mellitus ([place])   Hypothyroidism   Expressive aphasia  Andrea Warner is a 86 y.o. female with medical history significant for Alzheimer dementia, type 2 diabetes, hypertension,  hyperlipidemia, hypothyroidism, and depression who is admitted with expressive aphasia/slurred speech for stroke work-up.  Slurred speech/expressive aphasia: Waxing and waning expressive aphasia.  CT head without contrast is negative.  Seen by teleneurology recommended admission for further stroke work-up. -MRI brain -Echocardiogram, carotid Dopplers -Continue telemetry, neurochecks -Give aspirin 325 mg once now followed by 81 mg daily beginning tomorrow -Continue home lovastatin, reported unspecified atorvastatin intolerance -PT/OT/SLP eval -Check lipid panel, A1c -Allow permissive hypertension for now, start on IV fluids for BP support -Neurology consulted and will see, further recommendations appreciated  AKI: Creatinine 1.73 on admission compared to 0.96 on 06/07/2019. -Continue IV fluid hydration overnight -Hold home HCTZ and irbesartan  Abnormal urinalysis: Urinalysis showing pyuria/bacteriuria.  Patient denies any urinary symptoms.  Will obtain urine culture and treat if indicated.  Normocytic  anemia: Hemoglobin 9.1 compared to 12.3 on 06/07/2019.  No obvious bleeding.  FOBT is negative.  Obtain anemia panel.  Continue to monitor while on aspirin.  Sinus bradycardia: Remains in sinus bradycardia, currently asymptomatic.  Hold home diltiazem.  Check TSH.  Type 2 diabetes: Appears to be diet controlled.  Serum glucose stable.  Follow A1c and continue to monitor.  Hypertension: Holding home HCTZ, irbesartan, diltiazem to allow for permissive hypertension for now and in setting of AKI and bradycardia as listed above.  Hypothyroidism: Continue Synthroid.  Check TSH.  Hyperlipidemia: Continue lovastatin.  DVT prophylaxis: Subcutaneous heparin Code Status: DNR based on prior documentation with patient's geriatrician on 07/05/2019: Intensity of treatment options discussed with patient and family and a Medical Scope of Treatment (MOST) form was discussed. Patient and son would  like form mailed and they will review and discuss further. Patient does not think she would want CPR or life support. Wants fluids for trial period, no feeding tube.  Family Communication: Attempted to call son as listed under demographics without answer. Disposition Plan: From home, disposition pending further CVA work-up and PT/OT/SLP eval Consults called: Neurology Admission status:  Status is: Inpatient  Remains inpatient appropriate because:Ongoing diagnostic testing needed not appropriate for outpatient work up and Unsafe d/c plan  Dispo: The patient is from: Home              Anticipated d/c is to: TBD pending CVA work-up and PT/OT/SLP eval.              Anticipated d/c date is: 2 days              Patient currently is not medically stable to d/c.  Darreld Mclean MD [facility]  If 7PM-7AM, please contact night-coverage [email or web address]  09/16/2019, 9:51 PM

## 2019-09-16 NOTE — Progress Notes (Addendum)
Received from [facility] to room [phone] via stretcher at 2005. Assisted to bed and positioned for comfort. Oriented to room, bed and unit. NIH = 2. In no acute distress.

## 2019-09-16 NOTE — ED Provider Notes (Signed)
[facility] EMERGENCY DEPARTMENT Provider Note   CSN: [id number] Arrival date & time: 09/16/19  1304     History Chief Complaint  Patient presents with  . Slurred speech    Andrea Warner is a 86 y.o. female.  HPI      86 year old female with a history of Alzheimer's dementia, hypertension, hyperlipidemia, hypothyroidism, diabetes, weight loss, GERD presents with concern for slurred speech and difficulty speaking.  Family reports that they last spoke with her at 4 PM yesterday she was normal She reports today at [place] she had spoken with someone else on the phone without symptoms, and then didn't talk to herself following that and didn't notice a problem until she was picked up by family at 46.    Reports she is having difficulty finding words, slurred speech.  Son reports that the symptoms do seem to be waxing and waning.  No numbness, weakness, trouble walking, facial droop, visual changes.  She has some difficulty walking, with walking slowly at baseline but no acute changes today.  Denies any acute infectious symptoms, no chest pain, no shortness of breath.  Does acknowledge 2 weeks of black stool.  No lightheadedness.   Slurred speech and some trouble getting words out Son's Wife picked her up about 12 and noticed it Yesterday was ok, didn't go to sleep until 4AM, 4PM yesterday had normal conversation   Past Medical History:  Diagnosis Date  . Alzheimer's dementia ([place])   . Depression   . GERD (gastroesophageal reflux disease)   . Hypertension   . Thyroid disease     Patient Active Problem List   Diagnosis Date Noted  . CVA (cerebral vascular accident) ([place]) 09/16/2019    Past Surgical History:  Procedure Laterality Date  . ABDOMINAL HYSTERECTOMY    . FRACTURE SURGERY       OB History   No obstetric history on file.     No family history on file.  Social History   Tobacco Use  . Smoking status: Never Smoker  . Smokeless tobacco: Never Used    Substance Use Topics  . Alcohol use: No  . Drug use: Not on file    Home Medications Prior to Admission medications   Medication Sig Start Date End Date Taking? Authorizing Provider  azithromycin (ZITHROMAX) 250 MG tablet Take by mouth daily.    [provider]  ciclopirox (PENLAC) 8 % solution Apply topically at bedtime. Apply over nail and surrounding skin. Apply daily over previous coat. After seven (7) days, may remove with alcohol and continue cycle.    [provider]  DiltiaZEM HCl Coated Beads (CARTIA XT PO) Take by mouth.    [provider]  ezetimibe (ZETIA) 10 MG tablet Take 10 mg by mouth daily.    [provider]  fluconazole (DIFLUCAN) 150 MG tablet Take 150 mg by mouth daily.    [provider]  furosemide (LASIX) 40 MG tablet Take 40 mg by mouth.    [provider]  Ipratropium Bromide (ATROVENT IN) Inhale into the lungs.    [provider]  levothyroxine (SYNTHROID, LEVOTHROID) 50 MCG tablet Take 50 mcg by mouth daily before breakfast.    [provider]  metFORMIN (GLUCOPHAGE) 500 MG tablet Take by mouth 2 (two) times daily with a meal.    [provider]  montelukast (SINGULAIR) 10 MG tablet Take 10 mg by mouth at bedtime.    [provider]  mupirocin ointment (BACTROBAN) 2 % Place  1 application into the nose 2 (two) times daily.    [provider]  naproxen (NAPROSYN) 500 MG tablet Take 500 mg by mouth 2 (two) times daily with a meal.    [provider]  nitrofurantoin (MACRODANTIN) 100 MG capsule Take 100 mg by mouth 4 (four) times daily.    [provider]  omeprazole (PRILOSEC) 40 MG capsule Take 40 mg by mouth daily.    [provider]  promethazine-dextromethorphan (PROMETHAZINE-DM) 6.25-15 MG/5ML syrup Take by mouth 4 (four) times daily as needed for cough.    [provider]    Allergies    Actonel [risedronate sodium],  Amlodipine, Codeine, Flexeril [cyclobenzaprine], Iodine, Lipitor [atorvastatin], Losartan potassium-hctz, Penicillins, Singulair [montelukast sodium], Sulfa antibiotics, and Zafirlukast  Review of Systems   Review of Systems  Constitutional: Negative for fever.  HENT: Negative for sore throat.   Eyes: Negative for visual disturbance.  Respiratory: Negative for cough and shortness of breath.   Cardiovascular: Negative for chest pain.  Gastrointestinal: Negative for abdominal pain, nausea and vomiting.  Genitourinary: Negative for difficulty urinating.  Musculoskeletal: Negative for back pain and neck pain.  Skin: Negative for rash.  Neurological: Positive for speech difficulty. Negative for dizziness, seizures, syncope, facial asymmetry, weakness, numbness and headaches.    Physical Exam Updated Vital Signs BP (!) 156/56   Pulse 64   Temp 99.5 F (37.5 C) (Oral)   Resp 18   Wt 51.7 kg   SpO2 99%   BMI 20.19 kg/m   Physical Exam Vitals and nursing note reviewed.  Constitutional:      General: She is not in acute distress.    Appearance: She is well-developed. She is not diaphoretic.  HENT:     Head: Normocephalic and atraumatic.  Eyes:     Conjunctiva/sclera: Conjunctivae normal.  Cardiovascular:     Rate and Rhythm: Normal rate and regular rhythm.     Heart sounds: Normal heart sounds. No murmur heard.  No friction rub. No gallop.   Pulmonary:     Effort: Pulmonary effort is normal. No respiratory distress.     Breath sounds: Normal breath sounds. No wheezing or rales.  Abdominal:     General: There is no distension.     Palpations: Abdomen is soft.     Tenderness: There is no abdominal tenderness. There is no guarding.  Musculoskeletal:        General: No tenderness.     Cervical back: Normal range of motion.  Skin:    General: Skin is warm and dry.     Findings: No erythema or rash.  Neurological:     Mental Status: She is alert.     GCS: GCS eye subscore is  4. GCS verbal subscore is 5. GCS motor subscore is 6.     Cranial Nerves: Dysarthria present. No cranial nerve deficit or facial asymmetry.     Sensory: Sensation is intact. No sensory deficit.     Motor: Motor function is intact. No weakness, tremor or pronator drift.     Coordination: Coordination is intact. Finger-Nose-Finger Test normal.     Comments: Word finding problems, unable to name things (pen, phone, when checking for extinction takes a long time to find the word for "both"), waxing and waning fluency of speech Slurred States it is Friday in June, not sure of date     ED Results / Procedures / Treatments   Labs (all labs ordered are listed, but only abnormal results are displayed)  Labs Reviewed  COMPREHENSIVE METABOLIC PANEL - Abnormal; Notable for the following components:      Result Value   Sodium 133 (*)    Glucose, Bld 101 (*)    Creatinine, Ser 1.73 (*)    Albumin 3.0 (*)    GFR calc non Af Amer 26 (*)    GFR calc Af Amer 31 (*)    All other components within normal limits  CBC WITH DIFFERENTIAL/PLATELET - Abnormal; Notable for the following components:   RBC 3.19 (*)    Hemoglobin 9.1 (*)    HCT 28.1 (*)    Monocytes Absolute 1.2 (*)    Eosinophils Absolute 0.6 (*)    Basophils Absolute 0.2 (*)    Abs Immature Granulocytes 0.70 (*)    All other components within normal limits  URINALYSIS, ROUTINE W REFLEX MICROSCOPIC - Abnormal; Notable for the following components:   APPearance HAZY (*)    Leukocytes,Ua LARGE (*)    All other components within normal limits  URINALYSIS, MICROSCOPIC (REFLEX) - Abnormal; Notable for the following components:   Bacteria, UA MANY (*)    All other components within normal limits  SARS CORONAVIRUS 2 BY RT PCR (HOSPITAL ORDER, PERFORMED IN [facility])  ETHANOL  PROTIME-INR  APTT  RAPID URINE DRUG SCREEN, HOSP PERFORMED  OCCULT BLOOD X 1 CARD TO LAB, STOOL  CBG MONITORING, ED    EKG EKG  Interpretation  Date/Time:  Saturday September 16 2019 13:18:17 EDT Ventricular Rate:  45 PR Interval:    QRS Duration: 102 QT Interval:  483 QTC Calculation: 418 R Axis:   69 Text Interpretation: Sinus bradycardia Ventricular premature complex Low voltage, extremity leads No significant change since last tracing Confirmed by Alvira Monday ([id number]) on 09/16/2019 2:56:21 PM   Radiology CT Head Wo Contrast  Result Date: 09/16/2019 CLINICAL DATA:  Slurred speech since this morning. Left shoulder pain for a few weeks. EXAM: CT HEAD WITHOUT CONTRAST TECHNIQUE: Contiguous axial images were obtained from the base of the skull through the vertex without intravenous contrast. COMPARISON:  November 24, 2017 FINDINGS: Brain: No subdural, epidural, or subarachnoid hemorrhage. Ventricles and sulci are prominent but stable. Chronic white matter changes are identified. No acute cortical ischemia or infarct. No mass effect or midline shift. Cerebellum, brainstem, and basal cisterns are normal. Vascular: Calcified atherosclerosis is seen in the intracranial carotids. Skull: Normal. Negative for fracture or focal lesion. Sinuses/Orbits: No acute finding. Other: None. IMPRESSION: No acute intracranial abnormalities identified. Chronic white matter changes. Electronically Signed   By: Gerome Sam III M.D   On: 09/16/2019 14:38    Procedures Procedures (including critical care time)  Medications Ordered in ED Medications - No data to display  ED Course  I have reviewed the triage vital signs and the nursing notes.  Pertinent labs & imaging results that were available during my care of the patient were reviewed by me and considered in my medical decision making (see chart for details).    MDM Rules/Calculators/A&P                          86 year old female with a history of Alzheimer's dementia, hypertension, hyperlipidemia, hypothyroidism, diabetes, weight loss, GERD presents with concern for slurred  speech and difficulty speaking.    Code Stroke called given severity of aphasia at time of my evaluation within 24hr although acknowledge not meeting typical criteria given no extremity weakness. She unfortunately was unable to have  CTA as she has a contrast allergy.  Tele neurologist evaluated patient, will admit for CVA work up to [facility] and for MRI.  Of note, reports dark stool, hgb down to 9.5 from 13 in March.  No melena on exam, do not suspect acute GI bleed as cause of symptoms but ordered hemoccult and would recommend GI follow up.    Final Clinical Impression(s) / ED Diagnoses Final diagnoses:  Aphasia  Cerebrovascular accident (CVA), unspecified mechanism (HCC)    Rx / DC Orders ED Discharge Orders    None       Alvira Monday, MD 09/16/19 1647

## 2019-09-17 ENCOUNTER — Inpatient Hospital Stay (HOSPITAL_COMMUNITY): Payer: Medicare HMO

## 2019-09-17 DIAGNOSIS — G309 Alzheimer's disease, unspecified: Secondary | ICD-10-CM

## 2019-09-17 DIAGNOSIS — F0280 Dementia in other diseases classified elsewhere without behavioral disturbance: Secondary | ICD-10-CM

## 2019-09-17 DIAGNOSIS — R4781 Slurred speech: Secondary | ICD-10-CM

## 2019-09-17 DIAGNOSIS — I351 Nonrheumatic aortic (valve) insufficiency: Secondary | ICD-10-CM

## 2019-09-17 DIAGNOSIS — I340 Nonrheumatic mitral (valve) insufficiency: Secondary | ICD-10-CM

## 2019-09-17 LAB — LIPID PANEL
Cholesterol: 151 mg/dL (ref 0–200)
HDL: 41 mg/dL (ref >40)
LDL Cholesterol: 97 mg/dL (ref 0–99)
Total CHOL/HDL Ratio: 3.7 RATIO
Triglycerides: 63 mg/dL (ref <150)
VLDL: 13 mg/dL (ref 0–40)

## 2019-09-17 LAB — IRON AND TIBC
Iron: 62 ug/dL (ref 28–170)
Saturation Ratios: 41 % — ABNORMAL HIGH (ref 10.4–31.8)
TIBC: 151 ug/dL — ABNORMAL LOW (ref 250–450)
UIBC: 89 ug/dL

## 2019-09-17 LAB — VITAMIN B12: Vitamin B-12: 246 pg/mL (ref 180–914)

## 2019-09-17 LAB — CBC
HCT: 31.2 % — ABNORMAL LOW (ref 36.0–46.0)
Hemoglobin: 10.1 g/dL — ABNORMAL LOW (ref 12.0–15.0)
MCH: 27.9 pg (ref 26.0–34.0)
MCHC: 32.4 g/dL (ref 30.0–36.0)
MCV: 86.2 fL (ref 80.0–100.0)
Platelets: 242 10*3/uL (ref 150–400)
RBC: 3.62 MIL/uL — ABNORMAL LOW (ref 3.87–5.11)
RDW: 15.4 % (ref 11.5–15.5)
WBC: 9.6 10*3/uL (ref 4.0–10.5)
nRBC: 0 % (ref 0.0–0.2)

## 2019-09-17 LAB — BASIC METABOLIC PANEL
Anion gap: 9 (ref 5–15)
BUN: 16 mg/dL (ref 8–23)
CO2: 24 mmol/L (ref 22–32)
Calcium: 9.4 mg/dL (ref 8.9–10.3)
Chloride: 102 mmol/L (ref 98–111)
Creatinine, Ser: 1.3 mg/dL — ABNORMAL HIGH (ref 0.44–1.00)
GFR calc Af Amer: 43 mL/min — ABNORMAL LOW (ref >60)
GFR calc non Af Amer: 37 mL/min — ABNORMAL LOW (ref >60)
Glucose, Bld: 107 mg/dL — ABNORMAL HIGH (ref 70–99)
Potassium: 3.9 mmol/L (ref 3.5–5.1)
Sodium: 135 mmol/L (ref 135–145)

## 2019-09-17 LAB — FERRITIN: Ferritin: 933 ng/mL — ABNORMAL HIGH (ref 11–307)

## 2019-09-17 LAB — ECHOCARDIOGRAM COMPLETE: Weight: 1791.9 oz

## 2019-09-17 LAB — HEMOGLOBIN A1C
Hgb A1c MFr Bld: 6.6 % — ABNORMAL HIGH (ref 4.8–5.6)
Mean Plasma Glucose: 142.72 mg/dL

## 2019-09-17 LAB — TROPONIN I (HIGH SENSITIVITY): Troponin I (High Sensitivity): 264 ng/L (ref <18)

## 2019-09-17 LAB — TSH: TSH: 0.096 u[IU]/mL — ABNORMAL LOW (ref 0.350–4.500)

## 2019-09-17 LAB — MAGNESIUM: Magnesium: 1.6 mg/dL — ABNORMAL LOW (ref 1.7–2.4)

## 2019-09-17 LAB — FOLATE: Folate: 10.8 ng/mL (ref >5.9)

## 2019-09-17 MED ORDER — LEVOTHYROXINE SODIUM 50 MCG PO TABS: 50.0000 ug | ORAL_TABLET | Freq: Every day | ORAL | Status: DC

## 2019-09-17 MED ORDER — LACTATED RINGERS IV SOLN: INTRAVENOUS | Status: DC

## 2019-09-17 MED ORDER — MELATONIN 5 MG PO TABS
5.0000 mg | ORAL_TABLET | Freq: Every evening | ORAL | Status: DC | PRN
  Administered 2019-09-18: 5 mg via ORAL
  Filled 2019-09-17 (×2): qty 1

## 2019-09-17 MED ORDER — VITAMIN B-12 1000 MCG PO TABS: 1000.0000 ug | ORAL_TABLET | Freq: Every day | ORAL | Status: DC

## 2019-09-17 MED ORDER — CYANOCOBALAMIN 1000 MCG/ML IJ SOLN
1000.0000 ug | Freq: Every day | INTRAMUSCULAR | Status: AC
  Administered 2019-09-17 – 2019-09-19 (×3): 1000 ug via INTRAMUSCULAR
  Filled 2019-09-17 (×3): qty 1

## 2019-09-17 MED ORDER — MAGNESIUM SULFATE 2 GM/50ML IV SOLN
2.0000 g | Freq: Once | INTRAVENOUS | Status: AC
  Administered 2019-09-17: 2 g via INTRAVENOUS
  Filled 2019-09-17: qty 50

## 2019-09-17 NOTE — Progress Notes (Addendum)
Remains in junctional rhythm with HR of 47. MEWS now yellow. Resting in bed. In no distress. On call MD notified Gery Pray, MD).

## 2019-09-17 NOTE — Progress Notes (Signed)
  Echocardiogram 2D Echocardiogram has been performed.  Delcie Roch 09/17/2019, 9:37 AM

## 2019-09-17 NOTE — Progress Notes (Signed)
PT Cancellation Note  Patient Details Name: Andrea Warner MRN: [id number] DOB: [birth year]   Cancelled Treatment:    Reason Eval/Treat Not Completed: Patient at procedure or test/unavailable (vascular lab). Will check back for PT Evaluation as schedule permits.  Ina Homes, PT, DPT Acute Rehabilitation Services  Pager [phone] Office [phone]  Malachy Chamber 09/17/2019, 8:16 AM

## 2019-09-17 NOTE — Evaluation (Signed)
Speech Language Pathology Evaluation Patient Details Name: Andrea Warner MRN: [id number] DOB: 07-19-1931 Today's Date: 09/17/2019 Time: 5374-8270 SLP Time Calculation (min) (ACUTE ONLY): 15 min  Problem List:  Patient Active Problem List   Diagnosis Date Noted  . Slurred speech 09/16/2019  . Expressive aphasia 09/16/2019  . Alzheimer's dementia (HCC)   . Type 2 diabetes mellitus (HCC)   . Normocytic anemia   . AKI (acute kidney injury) (HCC)   . Hypertension associated with diabetes (HCC)   . Hyperlipidemia associated with type 2 diabetes mellitus (HCC)   . Hypothyroidism    Past Medical History:  Past Medical History:  Diagnosis Date  . Alzheimer's dementia (HCC)   . Depression   . GERD (gastroesophageal reflux disease)   . Hypertension   . Thyroid disease    Past Surgical History:  Past Surgical History:  Procedure Laterality Date  . ABDOMINAL HYSTERECTOMY    . FRACTURE SURGERY     HPI:   Andrea Warner is an 86 y.o. female with Alzheimer's dementia, HTN, DM, HLD, who presented initially to [facility] on Saturday afternoon with slurred speech and word finding difficulty.  CT head negative for acute abnormality, pending MRI.     Assessment / Plan / Recommendation Clinical Impression   Pt presents with mild word finding deficits in functional conversations which become more pronounced during structured speech tasks such as convergent naming, responsive naming, divergent naming, and confrontational naming.  Verbal errors are usually semantic or phonemic paraphasias which pt is aware of but needs assistance to correct.  Pt also exhibits mild-moderate memory deficits, requiring min-mod assist verbal cues to facilitate recall.  As a result, pt would benefit from skilled ST while inpatient in order to maximize functional independence and reduce burden of care prior to discharge.  Anticipate that pt will need 24/7 supervision at discharge in addition to possible ST follow up at next level of  care pending results of MRI    SLP Assessment  SLP Recommendation/Assessment: Patient needs continued Speech Lanaguage Pathology Services SLP Visit Diagnosis: Cognitive communication deficit (R41.841);Other (comment) (versus aphasia, pending MRI)    Follow Up Recommendations  Other (comment) (to be determined pending PT/OT evaluations)    Frequency and Duration min 2x/week         SLP Evaluation Cognition  Overall Cognitive Status: History of cognitive impairments - at baseline Arousal/Alertness: Awake/alert Orientation Level: Oriented to person;Oriented to place;Oriented to time Attention: Sustained Sustained Attention: Appears intact Memory: Impaired Memory Impairment: Retrieval deficit;Decreased recall of new information Awareness: Impaired Awareness Impairment: Emergent impairment       Comprehension  Auditory Comprehension Overall Auditory Comprehension: Appears within functional limits for tasks assessed    Expression Expression Primary Mode of Expression: Verbal Verbal Expression Overall Verbal Expression: Impaired Initiation: No impairment Level of Generative/Spontaneous Verbalization: Conversation Repetition: No impairment Naming: Impairment Responsive: 76-100% accurate Confrontation: Impaired Convergent: 75-100% accurate Divergent: 50-74% accurate Verbal Errors: Semantic paraphasias;Phonemic paraphasias Pragmatics: No impairment   Oral / Motor  Oral Motor/Sensory Function Overall Oral Motor/Sensory Function: Within functional limits Motor Speech Overall Motor Speech: Appears within functional limits for tasks assessed   GO                    Andrea Warner, Andrea Warner 09/17/2019, 10:11 AM

## 2019-09-17 NOTE — Progress Notes (Signed)
STROKE TEAM PROGRESS NOTE   HISTORY OF PRESENT ILLNESS (per record) Andrea Warner is an 86 y.o. female with Alzheimer's dementia, HTN, DM, HLD, who presented initially to [facility] on Saturday afternoon with slurred speech and word finding difficulty. LKN was 7 AM on Saturday when patient spoke with someone by telephone. Family picked her up at noon and noticed the deficits at that time. The symptoms seemed to wax and wane per her son. She denied changes in ambulation relative to her baseline, facial droop, vision changes, presyncopal sensation, limb weakness, or numbness. Also with no CP, SOB or symptoms of infection. She did have dark stools recently and EDP at OSH noted a drop in her Hgb to 9.5 from 13 in March.  Code Stroke was called and Teleneurology was consulted. NIHSS was 1. CT head showed no acute abnormality. She could not have a CTA due to contrast allergy. Teleneurology recommended admission for stroke work up.  LSN: 7 AM on Saturday tPA Given: No: Out of the time window   INTERVAL HISTORY She reports pain at baseline.  She definitely reports having difficulty speaking mostly with slurring of her speech.    OBJECTIVE Vitals:   09/17/19 0201 09/17/19 0330 09/17/19 0527 09/17/19 0700  BP: (!) 108/57 (!) 115/55 (!) 108/47   Pulse: (!) 48 (!) 41 (!) 43 (!) 47  Resp: 12 12 11 14   Temp: 98 F (36.7 C) 98 F (36.7 C) 97.9 F (36.6 C)   TempSrc: Oral Oral Oral   SpO2: 99% 99% 99% 99%  Weight:        CBC:  Recent Labs  Lab 09/16/19 1407 09/17/19 0233  WBC 10.5 9.6  NEUTROABS 5.8  --   HGB 9.1* 10.1*  HCT 28.1* 31.2*  MCV 88.1 86.2  PLT 224 818    Basic Metabolic Panel:  Recent Labs  Lab 09/16/19 1407 09/17/19 0233  NA 133* 135  K 3.6 3.9  CL 101 102  CO2 22 24  GLUCOSE 101* 107*  BUN 22 16  CREATININE 1.73* 1.30*  CALCIUM 9.1 9.4  MG  --  1.6*    Lipid Panel:     Component Value Date/Time   CHOL 151 09/17/2019 0233   TRIG 63 09/17/2019 0233   HDL 41  09/17/2019 0233   CHOLHDL 3.7 09/17/2019 0233   VLDL 13 09/17/2019 0233   LDLCALC 97 09/17/2019 0233   HgbA1c:  Lab Results  Component Value Date   HGBA1C 6.6 (H) 09/17/2019   Urine Drug Screen:     Component Value Date/Time   LABOPIA NONE DETECTED 09/16/2019 1541   COCAINSCRNUR NONE DETECTED 09/16/2019 1541   LABBENZ NONE DETECTED 09/16/2019 1541   AMPHETMU NONE DETECTED 09/16/2019 1541   THCU NONE DETECTED 09/16/2019 1541   LABBARB NONE DETECTED 09/16/2019 1541    Alcohol Level     Component Value Date/Time   ETH <10 09/16/2019 1407    IMAGING  CT Head Wo Contrast 09/16/2019 IMPRESSION:  No acute intracranial abnormalities identified. Chronic white matter changes.   MRI Brain WO Contrast -  09/17/19 IMPRESSION: 1. No acute intracranial abnormality or significant interval change. 2. Stable moderate atrophy and white matter disease. This likely reflects the sequela of chronic microvascular ischemia. 3. Heterogeneous marrow signal in the cervical spine. While this may be secondary to degenerative disease, metastatic disease or melanoma is not excluded. Recommend clinical correlation.    VAS US CAROTID (at [facility] and [facility] only) 09/17/2019 Summary:  Right Carotid: The extracranial  vessels were near-normal with only minimal wall thickening or plaque.  Left Carotid: The extracranial vessels were near-normal with only minimal wall thickening or plaque. Vertebrals:   Bilateral vertebral arteries: demonstrate antegrade flow.  Subclavians: Normal flow hemodynamics were seen in bilateral subclavian arteries.   Transthoracic Echocardiogram  00/00/2021 Pending  ECG - SR rate 67 BPM. (computer read as acute MI) (See cardiology reading for complete details)   PHYSICAL EXAM Blood pressure (!) 108/47, pulse (!) 47, temperature 97.9 F (36.6 C), temperature source Oral, resp. rate 14, weight 50.8 kg, SpO2 99 %. GENERAL: This is a thin female who is doing well at this  time.  HEENT: Neck is supple with no trauma noted.  ABDOMEN: soft  EXTREMITIES: No edema   BACK: Normal  SKIN: Normal by inspection.    MENTAL STATUS: Alert and oriented to location, year and month.  She converses well and is mostly lucid and coherent at this time.  Language is fine.  No dysarthria is noted.  CRANIAL NERVES: Pupils are equal, round and reactive to light; extra ocular movements are full, there is no significant nystagmus; visual fields are full; upper and lower facial muscles are normal in strength and symmetric, there is no flattening of the nasolabial folds; tongue is midline; uvula is midline; shoulder elevation is normal.  MOTOR: She has 4/5 strength throughout.  Bulk and tone are normal throughout.  COORDINATION: Left finger to nose is normal, right finger to nose is normal, No rest tremor; no intention tremor; no postural tremor; no bradykinesia.  SENSATION: Normal to light touch..           ASSESSMENT/PLAN [name] is a 86 y.o. female with history of Alzheimer's dementia, HTN, DM, hypothyroidism, HLD and dark stools recently with drop in her Hgb to 9.5 from 13 in March presenting with slurred speech and word finding difficulty.  She did not receive IV t-PA due to late presentation (>4.5 hours from time of onset)  Suspect stroke vs TIA: MRI pending  Resultant back to baseline.  Code Stroke CT Head -      CT head - No acute intracranial abnormalities identified. Chronic white matter changes.   MRI head - pending  MRA head - not ordered  CTA H&N - not ordered  CT Perfusion - not ordered  Carotid Doppler - unremarkable  2D Echo - pending  Sars Corona Virus 2 - negative  LDL - 97  HgbA1c - 6.6  UDS - negative  VTE prophylaxis - Cyril Heparin Diet  Diet Order            Diet heart healthy/carb modified Room service appropriate? Yes with Assist; Fluid consistency: Thin  Diet effective now                 No antithrombotic  prior to admission, now on aspirin 81 mg daily  Patient counseled to be compliant with her antithrombotic medications  Ongoing aggressive stroke risk factor management  Therapy recommendations:  pending  Disposition:  Pending  Hypertension  Home BP meds: Diltiazem ; HCTC ; Avapro  Current BP meds: none   BP mildly low . Permissive hypertension (OK if < 220/120) but gradually normalize in 5-7 days . Long-term BP goal normotensive  Hyperlipidemia  Home Lipid lowering medication: Mevacor 20 mg daily  LDL 97, goal < 70  Current lipid lowering medication: Consider increasing Mevacor dose or change to more potent statin  Continue statin at discharge  Diabetes  Home diabetic  meds: none   Current diabetic meds: none  HgbA1c 6.6, goal < 7.0 Recent Labs    09/16/19 1316  GLUCAP 90    Other Stroke Risk Factors  Advanced age  Other Active Problems  Code status - DNR  Anemia - Hgb - 10.1  CKD - stage 3b  - creatinine - 1.73->1.30  Bradycardia - asx  TSH low - 0.096  Abnormal ECG - check troponin   Hypomagnesemia - 1.6  Hospital day # 1    To contact Stroke Continuity provider, please refer to [email or web address]. After hours, contact General Neurology

## 2019-09-17 NOTE — Consult Note (Signed)
Referring Physician: Dr. Candiss Norse    Chief Complaint: Dysphasia  HPI: Andrea Warner is an 86 y.o. female with Alzheimer's dementia, HTN, DM, HLD, who presented initially to [facility] on Saturday afternoon with slurred speech and word finding difficulty. LKN was 7 AM on Saturday when patient spoke with someone by telephone. Family picked her up at noon and noticed the deficits at that time. The symptoms seemed to wax and wane per her son. She denied changes in ambulation relative to her baseline, facial droop, vision changes, presyncopal sensation, limb weakness, or numbness. Also with no CP, SOB or symptoms of infection. She did have dark stools recently and EDP at OSH noted a drop in her Hgb to 9.5 from 13 in March.   Code Stroke was called and Teleneurology was consulted. NIHSS was 1. CT head showed no acute abnormality. She could not have a CTA due to contrast allergy. Teleneurology recommended admission for stroke work up.   LSN: 7 AM on Saturday tPA Given: No: Out of the time window  Past Medical History:  Diagnosis Date  . Alzheimer's dementia ([place])   . Depression   . GERD (gastroesophageal reflux disease)   . Hypertension   . Thyroid disease     Past Surgical History:  Procedure Laterality Date  . ABDOMINAL HYSTERECTOMY    . FRACTURE SURGERY      Family History  Problem Relation Age of Onset  . Hypertension Father    Social History:  reports that she has never smoked. She has never used smokeless tobacco. She reports that she does not drink alcohol. No history on file for drug use.  Allergies:  Allergies  Allergen Reactions  . Iodine Shortness Of Breath and Rash  . Actonel [Risedronate Sodium]   . Amlodipine   . Codeine   . Flexeril [Cyclobenzaprine]   . Lipitor [Atorvastatin]   . Losartan Potassium-Hctz   . Penicillins   . Singulair [Montelukast Sodium]   . Sulfa Antibiotics   . Zafirlukast     Medications:  Prior to Admission:  Medications Prior to Admission   Medication Sig Dispense Refill Last Dose  . diltiazem (TIAZAC) 180 MG 24 hr capsule Take 180 mg by mouth daily.   Past Week at Unknown time  . donepezil (ARICEPT) 5 MG tablet Take 10 mg by mouth at bedtime.    Past Week at Unknown time  . hydrochlorothiazide (HYDRODIURIL) 25 MG tablet Take 25 mg by mouth daily.   Past Week at Unknown time  . irbesartan (AVAPRO) 300 MG tablet Take 300 mg by mouth daily.   Past Week at Unknown time  . levothyroxine (SYNTHROID) 75 MCG tablet Take 75 mcg by mouth daily before breakfast.   Past Week at Unknown time  . lovastatin (MEVACOR) 20 MG tablet Take 1 tablet by mouth daily.   Past Week at Unknown time   Scheduled: . aspirin EC  81 mg Oral Daily  . heparin  5,000 Units Subcutaneous Q8H  . [START ON 09/21/2019] levothyroxine  50 mcg Oral QAC breakfast  . pravastatin  20 mg Oral q1800   Continuous: . sodium chloride 100 mL/hr at 09/16/19 2334    ROS: As per HPI.   Physical Examination: Blood pressure (!) 108/47, pulse (!) 47, temperature 97.9 F (36.6 C), temperature source Oral, resp. rate 14, weight 50.8 kg, SpO2 99 %.  HEENT: [place]/AT Lungs: Respirations unlabored Ext: Warm and well perfused  Neurologic Examination: Ment: Awake and alert. Speech is terse and with decreased information  contact, but is fluent. Mildly dysarthric. Able to follow most commands, but often requires them to be repeated or explained to her. Moderate word finding difficulty. Orientation is fair. Insight is poor.  CN: Visual fields intact. PERRL. EOMI. Temp sensation equal. Mild right facial droop. Hearing intact to voice. Phonation intact. Head midline. Tongue protrudes midline.  Motor: 4+/5 x 4 without asymmetry. No pronator drift.  Sensory: Intact to FT and temp x 4. No extinction to DSS.  Reflexes: 1+ bilateral biceps and brachioradialis. 0 patellae and achilles bilaterally.  Cerebellar: No ataxia with FNF bilaterally.  Gait: Deferred  Results for orders placed or  performed during the hospital encounter of 09/16/19 (from the past 48 hour(s))  POC CBG, ED     Status: None   Collection Time: 09/16/19  1:16 PM  Result Value Ref Range   Glucose-Capillary 90 70 - 99 mg/dL    Comment: Glucose reference range applies only to samples taken after fasting for at least 8 hours.   Comment 1 Notify RN    Comment 2 Document in Chart   Comprehensive metabolic panel     Status: Abnormal   Collection Time: 09/16/19  2:07 PM  Result Value Ref Range   Sodium 133 (L) 135 - 145 mmol/L   Potassium 3.6 3.5 - 5.1 mmol/L   Chloride 101 98 - 111 mmol/L   CO2 22 22 - 32 mmol/L   Glucose, Bld 101 (H) 70 - 99 mg/dL    Comment: Glucose reference range applies only to samples taken after fasting for at least 8 hours.   BUN 22 8 - 23 mg/dL   Creatinine, Ser 0.24 (H) 0.44 - 1.00 mg/dL   Calcium 9.1 8.9 - 09.7 mg/dL   Total Protein 7.1 6.5 - 8.1 g/dL   Albumin 3.0 (L) 3.5 - 5.0 g/dL   AST 20 15 - 41 U/L   ALT 10 0 - 44 U/L   Alkaline Phosphatase 62 38 - 126 U/L   Total Bilirubin 0.7 0.3 - 1.2 mg/dL   GFR calc non Af Amer 26 (L) >60 mL/min   GFR calc Af Amer 31 (L) >60 mL/min   Anion gap 10 5 - 15    Comment: Performed at [facility], [address] [place], [place] [address]  CBC with Differential     Status: Abnormal   Collection Time: 09/16/19  2:07 PM  Result Value Ref Range   WBC 10.5 4.0 - 10.5 K/uL   RBC 3.19 (L) 3.87 - 5.11 MIL/uL   Hemoglobin 9.1 (L) 12.0 - 15.0 g/dL   HCT 92.4 (L) 36 - 46 %   MCV 88.1 80.0 - 100.0 fL   MCH 28.5 26.0 - 34.0 pg   MCHC 32.4 30.0 - 36.0 g/dL   RDW 26.8 34.1 - 96.2 %   Platelets 224 150 - 400 K/uL   nRBC 0.2 0.0 - 0.2 %   Neutrophils Relative % 55 %   Neutro Abs 5.8 1.7 - 7.7 K/uL   Lymphocytes Relative 18 %   Lymphs Abs 1.9 0.7 - 4.0 K/uL   Monocytes Relative 12 %   Monocytes Absolute 1.2 (H) 0 - 1 K/uL   Eosinophils Relative 6 %   Eosinophils Absolute 0.6 (H) 0 - 0 K/uL   Basophils Relative 2 %   Basophils  Absolute 0.2 (H) 0 - 0 K/uL   WBC Morphology MILD LEFT SHIFT (1-5% METAS, OCC MYELO, OCC BANDS)    RBC Morphology  MORPHOLOGY UNREMARKABLE    Smear Review Normal platelet morphology    Immature Granulocytes 7 %   Abs Immature Granulocytes 0.70 (H) 0.00 - 0.07 K/uL    Comment: Performed at [facility] [place], [address] [place], [place] [address]  Ethanol     Status: None   Collection Time: 09/16/19  2:07 PM  Result Value Ref Range   Alcohol, Ethyl (B) <10 <10 mg/dL    Comment: (NOTE) Lowest detectable limit for serum alcohol is 10 mg/dL.  For medical purposes only. Performed at [facility] [place], [address] [place], [place] [address]   Protime-INR     Status: None   Collection Time: 09/16/19  2:07 PM  Result Value Ref Range   Prothrombin Time 14.3 11.4 - 15.2 seconds   INR 1.2 0.8 - 1.2    Comment: (NOTE) INR goal varies based on device and disease states. Performed at [facility] [place], [address] [place], [place] [address]   APTT     Status: None   Collection Time: 09/16/19  2:07 PM  Result Value Ref Range   aPTT 31 24 - 36 seconds    Comment: Performed at [facility] [place], [address] [place], [place] [address]  SARS Coronavirus 2 by RT PCR (hospital order, performed in [facility] hospital lab) Nasopharyngeal Nasopharyngeal Swab     Status: None   Collection Time: 09/16/19  3:21 PM   Specimen: Nasopharyngeal Swab  Result Value Ref Range   SARS Coronavirus 2 NEGATIVE NEGATIVE    Comment: (NOTE) SARS-CoV-2 target nucleic acids are NOT DETECTED.  The SARS-CoV-2 RNA is generally detectable in upper and lower respiratory specimens during the acute phase of infection. The lowest concentration of SARS-CoV-2 viral copies this assay can detect is 250 copies / mL. A negative result does not preclude SARS-CoV-2 infection and should not be used as the sole basis for treatment or other patient management decisions.  A negative result may  occur with improper specimen collection / handling, submission of specimen other than nasopharyngeal swab, presence of viral mutation(s) within the areas targeted by this assay, and inadequate number of viral copies (<250 copies / mL). A negative result must be combined with clinical observations, patient history, and epidemiological information.  Fact Sheet for Patients:   BoilerBrush.com.cyhttps://www.fda.gov/media/136312/download  Fact Sheet for Healthcare Providers: https://pope.com/https://www.fda.gov/media/136313/download  This test is not yet approved or  cleared by the [place] FDA and has been authorized for detection and/or diagnosis of SARS-CoV-2 by FDA under an Emergency Use Authorization (EUA).  This EUA will remain in effect (meaning this test can be used) for the duration of the COVID-19 declaration under Section 564(b)(1) of the Act, 21 U.S.C. section 360bbb-3(b)(1), unless the authorization is terminated or revoked sooner.  Performed at [facility] [place], [address] [place], [place] [address]   Urine rapid drug screen (hosp performed)     Status: None   Collection Time: 09/16/19  3:41 PM  Result Value Ref Range   Opiates NONE DETECTED NONE DETECTED   Cocaine NONE DETECTED NONE DETECTED   Benzodiazepines NONE DETECTED NONE DETECTED   Amphetamines NONE DETECTED NONE DETECTED   Tetrahydrocannabinol NONE DETECTED NONE DETECTED   Barbiturates NONE DETECTED NONE DETECTED    Comment: (NOTE) DRUG SCREEN FOR MEDICAL PURPOSES ONLY.  IF CONFIRMATION IS NEEDED FOR ANY PURPOSE, NOTIFY LAB WITHIN 5 DAYS.  LOWEST DETECTABLE LIMITS FOR URINE DRUG SCREEN Drug  Class                     Cutoff (ng/mL) Amphetamine and metabolites    1000 Barbiturate and metabolites    200 Benzodiazepine                 200 Tricyclics and metabolites     300 Opiates and metabolites        300 Cocaine and metabolites        300 THC                            50 Performed at [facility], [address] [place], [place] [address]   Urinalysis, Routine w reflex microscopic     Status: Abnormal   Collection Time: 09/16/19  3:41 PM  Result Value Ref Range   Color, Urine YELLOW YELLOW   APPearance HAZY (A) CLEAR   Specific Gravity, Urine 1.015 1.005 - 1.030   pH 6.0 5.0 - 8.0   Glucose, UA NEGATIVE NEGATIVE mg/dL   Hgb urine dipstick NEGATIVE NEGATIVE   Bilirubin Urine NEGATIVE NEGATIVE   Ketones, ur NEGATIVE NEGATIVE mg/dL   Protein, ur NEGATIVE NEGATIVE mg/dL   Nitrite NEGATIVE NEGATIVE   Leukocytes,Ua LARGE (A) NEGATIVE    Comment: Performed at [facility], [address] [place], [place] [address]  Urinalysis, Microscopic (reflex)     Status: Abnormal   Collection Time: 09/16/19  3:41 PM  Result Value Ref Range   RBC / HPF NONE SEEN 0 - 5 RBC/hpf   WBC, UA 21-50 0 - 5 WBC/hpf   Bacteria, UA MANY (A) NONE SEEN   Squamous Epithelial / LPF 0-5 0 - 5   Hyaline Casts, UA PRESENT     Comment: Performed at  [facility], [address] [place], [place] [address]  Occult blood card to lab, stool     Status: None   Collection Time: 09/16/19  3:52 PM  Result Value Ref Range   Fecal Occult Bld NEGATIVE NEGATIVE    Comment: Performed at [facility], [address] [place], [place] [address]  Hemoglobin A1c     Status: Abnormal   Collection Time: 09/17/19  2:33 AM  Result Value Ref Range   Hgb A1c MFr Bld 6.6 (H) 4.8 - 5.6 %    Comment: (NOTE) Pre diabetes:          5.7%-6.4%  Diabetes:              >6.4%  Glycemic control for   <7.0% adults with diabetes    Mean Plasma Glucose 142.72 mg/dL    Comment: Performed at [facility], [address], [place], [place] [address]  Lipid panel     Status: None   Collection Time: 09/17/19  2:33 AM  Result Value Ref Range   Cholesterol 151 0 - 200 mg/dL   Triglycerides 63 <130 mg/dL   HDL 41 >86 mg/dL   Total CHOL/HDL Ratio 3.7 RATIO   VLDL 13 0 - 40 mg/dL   LDL Cholesterol 97 0 -  99 mg/dL    Comment:        Total Cholesterol/HDL:CHD Risk Coronary Heart Disease Risk Table                     Men   Women  1/2 Average Risk  3.4   3.3  Average Risk       5.0   4.4  2 X Average Risk   9.6   7.1  3 X Average Risk  23.4   11.0        Use the calculated Patient Ratio above and the CHD Risk Table to determine the patient's CHD Risk.        ATP III CLASSIFICATION (LDL):  <100     mg/dL   Optimal  098-119  mg/dL   Near or Above                    Optimal  130-159  mg/dL   Borderline  147-829  mg/dL   High  >562     mg/dL   Very High Performed at [facility], [address] [place], [place] [address]   CBC     Status: Abnormal   Collection Time: 09/17/19  2:33 AM  Result Value Ref Range   WBC 9.6 4.0 - 10.5 K/uL   RBC 3.62 (L) 3.87 - 5.11 MIL/uL   Hemoglobin 10.1 (L) 12.0 - 15.0 g/dL   HCT 57.8 (L) 36 - 46 %   MCV 86.2 80.0 - 100.0 fL   MCH 27.9 26.0 - 34.0 pg   MCHC 32.4 30.0 - 36.0 g/dL   RDW 46.9 62.9 - 52.8 %   Platelets 242 150 - 400 K/uL   nRBC 0.0 0.0 - 0.2 %    Comment: Performed at [facility], [address] [place], [place] [address]  Basic metabolic panel     Status: Abnormal   Collection Time: 09/17/19  2:33 AM  Result Value Ref Range   Sodium 135 135 - 145 mmol/L   Potassium 3.9 3.5 - 5.1 mmol/L   Chloride 102 98 - 111 mmol/L   CO2 24 22 - 32 mmol/L   Glucose, Bld 107 (H) 70 - 99 mg/dL    Comment: Glucose reference range applies only to samples taken after fasting for at least 8 hours.   BUN 16 8 - 23 mg/dL   Creatinine, Ser 4.01 (H) 0.44 - 1.00 mg/dL   Calcium 9.4 8.9 - 02.7 mg/dL   GFR calc non Af Amer 37 (L) >60 mL/min   GFR calc Af Amer 43 (L) >60 mL/min   Anion gap 9 5 - 15    Comment: Performed at [facility], [address], [place], [place] [address]  TSH     Status: Abnormal   Collection Time: 09/17/19  2:33 AM  Result Value Ref Range   TSH 0.096 (L) 0.350 - 4.500 uIU/mL    Comment: Performed by a 3rd  Generation assay with a functional sensitivity of <=0.01 uIU/mL. Performed at [facility], [address], [place], [place] [address]   Magnesium     Status: Abnormal   Collection Time: 09/17/19  2:33 AM  Result Value Ref Range   Magnesium 1.6 (L) 1.7 - 2.4 mg/dL    Comment: Performed at [facility], [address], [place], [place] [address]  Vitamin B12     Status: None   Collection Time: 09/17/19  2:33 AM  Result Value Ref Range   Vitamin B-12 246 180 - 914 pg/mL    Comment: (NOTE) This assay is not validated for testing neonatal or myeloproliferative syndrome specimens for Vitamin B12 levels. Performed at [facility], [address] [place],  [place] [address]   Folate     Status: None   Collection Time: 09/17/19  2:33 AM  Result Value Ref Range   Folate 10.8 >5.9 ng/mL    Comment: Performed at [facility], [address] [place], [place] [address]  Iron and TIBC     Status: Abnormal   Collection Time: 09/17/19  2:33 AM  Result Value Ref Range   Iron 62 28 - 170 ug/dL   TIBC 035 (L) 009 - 381 ug/dL   Saturation Ratios 41 (H) 10.4 - 31.8 %   UIBC 89 ug/dL    Comment: Performed at [facility], [address] [place], [place] [address]  Ferritin     Status: Abnormal   Collection Time: 09/17/19  2:33 AM  Result Value Ref Range   Ferritin 933 (H) 11 - 307 ng/mL    Comment: Performed at [facility], [address] [place], [place] [address]   CT Head Wo Contrast  Result Date: 09/16/2019 CLINICAL DATA:  Slurred speech since this morning. Left shoulder pain for a few weeks. EXAM: CT HEAD WITHOUT CONTRAST TECHNIQUE: Contiguous axial images were obtained from the base of the skull through the vertex without intravenous contrast. COMPARISON:  November 24, 2017 FINDINGS: Brain: No subdural, epidural, or subarachnoid hemorrhage. Ventricles and sulci are prominent but stable. Chronic white matter changes are identified. No acute cortical  ischemia or infarct. No mass effect or midline shift. Cerebellum, brainstem, and basal cisterns are normal. Vascular: Calcified atherosclerosis is seen in the intracranial carotids. Skull: Normal. Negative for fracture or focal lesion. Sinuses/Orbits: No acute finding. Other: None. IMPRESSION: No acute intracranial abnormalities identified. Chronic white matter changes. Electronically Signed   By: Gerome Sam III M.D   On: 09/16/2019 14:38   VAS US CAROTID (at [facility] and [facility] only)  Result Date: 09/17/2019 Carotid Arterial Duplex Study Indications:       Speech disturbance. Risk Factors:      Hypertension, hyperlipidemia, Diabetes. Other Factors:     Alzheimer's. Comparison Study:  No prior study on file for comparison Performing Technologist: Sherren Kerns RVS  Examination Guidelines: A complete evaluation includes B-mode imaging, spectral Doppler, color Doppler, and power Doppler as needed of all accessible portions of each vessel. Bilateral testing is considered an integral part of a complete examination. Limited examinations for reoccurring indications may be performed as noted.  Right Carotid Findings: +----------+--------+--------+--------+------------------+------------------+           PSV cm/sEDV cm/sStenosisPlaque DescriptionComments           +----------+--------+--------+--------+------------------+------------------+ CCA Prox  57      13                                                   +----------+--------+--------+--------+------------------+------------------+ CCA Distal87      12                                intimal thickening +----------+--------+--------+--------+------------------+------------------+ ICA Prox  61      16              heterogenous                         +----------+--------+--------+--------+------------------+------------------+ ICA Distal71  22                                                    +----------+--------+--------+--------+------------------+------------------+ ECA       62      16                                                   +----------+--------+--------+--------+------------------+------------------+ +----------+--------+-------+--------+-------------------+           PSV cm/sEDV cmsDescribeArm Pressure (mmHG) +----------+--------+-------+--------+-------------------+ Subclavian119                                        +----------+--------+-------+--------+-------------------+ +---------+--------+--+--------+--+ VertebralPSV cm/s78EDV cm/s10 +---------+--------+--+--------+--+  Left Carotid Findings: +----------+--------+--------+--------+------------------+--------+           PSV cm/sEDV cm/sStenosisPlaque DescriptionComments +----------+--------+--------+--------+------------------+--------+ CCA Prox  104     20                                         +----------+--------+--------+--------+------------------+--------+ CCA Distal70      14                                         +----------+--------+--------+--------+------------------+--------+ ICA Prox  60      16              heterogenous               +----------+--------+--------+--------+------------------+--------+ ICA Distal55      17                                         +----------+--------+--------+--------+------------------+--------+ ECA       24      1                                          +----------+--------+--------+--------+------------------+--------+ +----------+--------+--------+--------+-------------------+           PSV cm/sEDV cm/sDescribeArm Pressure (mmHG) +----------+--------+--------+--------+-------------------+ [id number]                                          +----------+--------+--------+--------+-------------------+ +---------+--------+--+--------+--+ VertebralPSV cm/s70EDV cm/s11  +---------+--------+--+--------+--+   Summary: Right Carotid: The extracranial vessels were near-normal with only minimal wall                thickening or plaque. Left Carotid: The extracranial vessels were near-normal with only minimal wall               thickening or plaque. Vertebrals:  Bilateral vertebral arteries demonstrate antegrade flow. Subclavians: Normal flow hemodynamics were seen in bilateral subclavian  arteries. *See table(s) above for measurements and observations.     Preliminary     Assessment: 86 y.o. female presenting with new onset dysphasia and dysarthria.  1. Exam reveals findings most consistent with stroke on a baseline of Alzheimer's dementia.  2. CT head showed no acute abnormality 3. Carotid ultrasound: Minimal ICA wall thickening/plaque bilaterally. Vertebrals antegrade.  4. Stroke Risk Factors -  HTN, DM and HLD 5. Low vitamin B12 level.   Recommendations: 1. HgbA1c, fasting lipid panel 2. MRI, MRA of the brain without contrast 3. PT consult, OT consult, Speech consult 4. TTE 5. Cardiac telemetry 6. Prophylactic therapy- Agree with starting ASA 7. Frequent neuro checks 8. BP management. 9. Supplement vitamin B12 orally 2000 mcg qd. Repeat level in 2 months.     signed: Dr. Caryl Pina  09/17/2019, 9:34 AM

## 2019-09-17 NOTE — Evaluation (Signed)
Occupational Therapy Evaluation Patient Details Name: Andrea Warner MRN: [id number] DOB: May 10, 1931 Today's Date: 09/17/2019    History of Present Illness 86 y.o female presenting with slurred speech. CT unremarkable, awaiting MRI. PMH includes Alzheimer's dementia, HTN, DM, HLD.   Clinical Impression   Pt admitted with above diagnoses, presenting with generalized weakness and confusion from baseline AD. Unsure of exact PLOF due to pt being poor historian, but she reports using a cane for mobility and living alone (also mentions sister in law and family from [facility] At time of eval, pt completing bed mobility at min A and sit <> stands with min-mod A and RW. Pt requiring mod A and RW to pivot to recliner, due to difficulty advancing BLEs from resting on side of bed with forward flexed posture. Pt incontinent of urine at time of eval, was able to engage in peri hygiene with min A sitting and mod A standing. She is overall pleasantly confused. Given current status recommend pt d/c to SNF level of care vs memory care for continued support of safety and BADL engagement. Will continue to follow per POC listed below.     Follow Up Recommendations  SNF;Other (comment) (vs memory care?)    Equipment Recommendations  Other (comment) (TBD)    Recommendations for Other Services       Precautions / Restrictions Precautions Precautions: Fall Precaution Comments: bradycardic Restrictions Weight Bearing Restrictions: No      Mobility Bed Mobility Overal bed mobility: Needs Assistance Bed Mobility: Supine to Sit     Supine to sit: Min assist     General bed mobility comments: increased time and effort with simple step by step cues. Pt uses OT hand to pull trunk into sitting. Cues for appropriate placement of LEs to maintain sitting balance safely  Transfers Overall transfer level: Needs assistance Equipment used: Rolling walker (2 wheeled) Transfers: Sit to/from [employer] Sit to Stand: Min assist Stand pivot transfers: Mod assist       General transfer comment: min A to rise and steady with cues to attain upright posture. Pt remains with flexed hips despite cues and facilitation. Mod A to pivot for safety and pt difficulty of advancing BLEs    Balance Overall balance assessment: Needs assistance Sitting-balance support: Feet supported Sitting balance-Leahy Scale: Fair (to poor) Sitting balance - Comments: pt able to maintain sitting until engaging in LB Dressing and losing balance posteriorly. With increased time pt is able to flex trunk back up to sitting Postural control: Posterior lean Standing balance support: Bilateral upper extremity supported;During functional activity Standing balance-Leahy Scale: Poor Standing balance comment: reliance on RW and external staff support for safe standing balance                           ADL either performed or assessed with clinical judgement   ADL Overall ADL's : Needs assistance/impaired Eating/Feeding: Set up;Sitting   Grooming: Set up;Sitting   Upper Body Bathing: Set up;Sitting   Lower Body Bathing: Minimal assistance;Sit to/from stand;Sitting/lateral leans Lower Body Bathing Details (indicate cue type and reason): pt washed tops of thighs after being incontinent of urine. Needed min A for thorough task completion Upper Body Dressing : Set up;Sitting   Lower Body Dressing: Minimal assistance;Moderate assistance;Sit to/from stand;Sitting/lateral leans Lower Body Dressing Details (indicate cue type and reason): min A to don/doff socks. Mod A to pull panties up over hips in standing Toilet Transfer: Moderate assistance;Stand-pivot;BSC;RW Statistician  Details (indicate cue type and reason): simulated to recliner, Pt with difficulty advancing feet to take steps to recliner and needing to pivot Toileting- Clothing Manipulation and Hygiene: Minimal assistance;Sit to/from  stand;Sitting/lateral lean Toileting - Clothing Manipulation Details (indicate cue type and reason): min A for thorough task completion     Functional mobility during ADLs: Minimal assistance;Moderate assistance;Cueing for safety;Cueing for sequencing;Rolling walker       Vision Patient Visual Report: No change from baseline       Perception     Praxis      Pertinent Vitals/Pain Pain Assessment: No/denies pain     Hand Dominance     Extremity/Trunk Assessment Upper Extremity Assessment Upper Extremity Assessment: Generalized weakness   Lower Extremity Assessment Lower Extremity Assessment: Generalized weakness       Communication Communication Communication: No difficulties   Cognition Arousal/Alertness: Awake/alert Behavior During Therapy: WFL for tasks assessed/performed Overall Cognitive Status: History of cognitive impairments - at baseline Area of Impairment: Orientation;Memory;Following commands;Safety/judgement;Problem solving                 Orientation Level: Disoriented to;Place;Time (able to recall being here for slurring, but then begins to be confused with details of story)   Memory: Decreased short-term memory Following Commands: Follows one step commands with increased time Safety/Judgement: Decreased awareness of safety;Decreased awareness of deficits   Problem Solving: Slow processing;Decreased initiation;Difficulty sequencing;Requires verbal cues;Requires tactile cues General Comments: pt requires simple step by step VC's to complete basic mobility tasks. She waxes and wanes in mental clarity- at times can accurately state surrounding, but then quickly becomes confused with details   General Comments       Exercises     Shoulder Instructions      Home Living Family/patient expects to be discharged to:: Private residence Living Arrangements: Other relatives Available Help at Discharge: Family Type of Home: House Home Access: Stairs  to enter Technical brewer of Steps: unsure   Home Layout: One level     Bathroom Shower/Tub: Tub/shower unit (take baths)   Bathroom Toilet: Standard     Home Equipment: Cane - single point   Additional Comments: unsure of accuracy due to baseline dementia. States family members check in a lot but lives with sister in law?      Prior Functioning/Environment Level of Independence: Independent with assistive device(s)        Comments: reports walking around with cane        OT Problem List: Decreased strength;Decreased knowledge of use of DME or AE;Decreased activity tolerance;Decreased cognition;Cardiopulmonary status limiting activity;Impaired balance (sitting and/or standing);Decreased safety awareness      OT Treatment/Interventions: Self-care/ADL training;Therapeutic exercise;Patient/family education;Balance training;Energy conservation;Therapeutic activities;DME and/or AE instruction;Cognitive remediation/compensation    OT Goals(Current goals can be found in the care plan section) Acute Rehab OT Goals Patient Stated Goal: to walk more independently OT Goal Formulation: With patient Time For Goal Achievement: 10/01/19 Potential to Achieve Goals: Good  OT Frequency: Min 2X/week   Barriers to D/C:    lives alone       Co-evaluation              AM-PAC OT "6 Clicks" Daily Activity     Outcome Measure Help from another person eating meals?: A Little Help from another person taking care of personal grooming?: A Little Help from another person toileting, which includes using toliet, bedpan, or urinal?: A Lot Help from another person bathing (including washing, rinsing, drying)?: A Lot Help from another person  to put on and taking off regular upper body clothing?: A Little Help from another person to put on and taking off regular lower body clothing?: A Lot 6 Click Score: 15   End of Session Equipment Utilized During Treatment: Gait belt;Rolling  walker Nurse Communication: Mobility status  Activity Tolerance: Patient tolerated treatment well Patient left: in chair;with call bell/phone within reach;with chair alarm set  OT Visit Diagnosis: Unsteadiness on feet (R26.81);Other abnormalities of gait and mobility (R26.89);Muscle weakness (generalized) (M62.81);Other symptoms and signs involving cognitive function                Time: 2694-8546 OT Time Calculation (min): 33 min Charges:  OT General Charges $OT Visit: 1 Visit OT Evaluation $OT Eval Moderate Complexity: 1 Mod OT Treatments $Self Care/Home Management : 8-22 mins  Dalphine Handing, MSOT, OTR/L Acute Rehabilitation Services [facility] Office Number: [phone] Pager: [phone]  Dalphine Handing 09/17/2019, 11:43 AM

## 2019-09-17 NOTE — Progress Notes (Signed)
PROGRESS NOTE                                                                                                                                                                                                             Patient Demographics:    Andrea Warner, is a 86 y.o. female, DOB - 04/02/1931, JQB:[id number]  Admit date - 09/16/2019   Admitting Physician Charlsie Quest, MD  Outpatient Primary MD for the patient is Patient, No Pcp Per  LOS - 1  Chief Complaint  Patient presents with  . Slurred speech       Brief Narrative  Andrea Warner is a 86 y.o. female with medical history significant for Alzheimer dementia, type 2 diabetes, hypertension, hyperlipidemia, hypothyroidism, and depression who presents to the ED for evaluation of slurred speech.   Subjective:    Gayathri Futrell today has, No headache, No chest pain, No abdominal pain - No Nausea, No new weakness tingling or numbness, No Cough - SOB.     Assessment  & Plan :     1.  TIA versus CVA.  Still has some left-sided upper extremity weakness, speech has improved, MRI pending CT nonacute, complete stroke work-up underway.  On aspirin and statin for now.  Neurology on board defer further work-up to neurology.  Lab Results  Component Value Date   HGBA1C 6.6 (H) 09/17/2019   Lab Results  Component Value Date   CHOL 151 09/17/2019   HDL 41 09/17/2019   LDLCALC 97 09/17/2019   TRIG 63 09/17/2019   CHOLHDL 3.7 09/17/2019    2.  Mild early Alzheimer's dementia.  Still quite functional and drives, at risk for delirium, family informed, minimize benzos and narcotics.  Will resume Aricept if bradycardia resolves.  3.  Essential hypertension.  Allow for permissive hypertension for now.  4.  Dyslipidemia.  LDL stable on present dose statin.  5.  Hypothyroidism.  Synthroid dose held for 3 days, lowered the dose thereafter as TSH was suppressed.  6.  Bradycardia at rest.  With stable blood pressures.  Hold diltiazem and  Aricept and monitor.  7.  Dehydration, AKI.  IV fluids and monitor.   8.  Hypomagnesemia.  Replace.   9.  DM type II.  Diet controlled, A1c acceptable for her age.  On Glucophage upon discharge.   CBG (last 3)  Recent Labs    09/16/19 1316  GLUCAP 90     Condition -  Guarded  Family Communication  :  Son Deniece PortelaWayne [phone] on 09/17/2019  Code Status :  DNR  Consults  :  Neuro  Procedures  :    CT - Non acute  TTE  MRI   PUD Prophylaxis : None  Disposition Plan  :    Status is: Inpatient  Remains inpatient appropriate because:Ongoing diagnostic testing needed not appropriate for outpatient work up   Dispo: The patient is from: Home              Anticipated d/c is to: SNF              Anticipated d/c date is: 3 days              Patient currently is not medically stable to d/c.  DVT Prophylaxis  :   Heparin    Lab Results  Component Value Date   PLT 242 09/17/2019    Diet :  Diet Order            Diet heart healthy/carb modified Room service appropriate? Yes with Assist; Fluid consistency: Thin  Diet effective now                  Inpatient Medications Scheduled Meds: . aspirin EC  81 mg Oral Daily  . heparin  5,000 Units Subcutaneous Q8H  . [START ON 09/21/2019] levothyroxine  50 mcg Oral QAC breakfast  . pravastatin  20 mg Oral q1800   Continuous Infusions: PRN Meds:.acetaminophen **OR** acetaminophen (TYLENOL) oral liquid 160 mg/5 mL **OR** acetaminophen, senna-docusate  Antibiotics  :   Anti-infectives (From admission, onward)   None          Objective:   Vitals:   09/17/19 0201 09/17/19 0330 09/17/19 0527 09/17/19 0700  BP: (!) 108/57 (!) 115/55 (!) 108/47   Pulse: (!) 48 (!) 41 (!) 43 (!) 47  Resp: 12 12 11 14   Temp: 98 F (36.7 C) 98 F (36.7 C) 97.9 F (36.6 C)   TempSrc: Oral Oral Oral   SpO2: 99% 99% 99% 99%  Weight:        SpO2: 99 %  Wt Readings from Last 3 Encounters:  09/16/19 50.8 kg  10/16/14 75.8 kg      Intake/Output Summary (Last 24 hours) at 09/17/2019 1020 Last data filed at 09/17/2019 0700 Gross per 24 hour  Intake 485.14 ml  Output 200 ml  Net 285.14 ml     Physical Exam  Awake Alert,mildly confused, speech near normal, mild L arm weakness [place].AT,PERRAL Supple Neck,No JVD, No cervical lymphadenopathy appriciated.  Symmetrical Chest wall movement, Good air movement bilaterally, CTAB RRR,No Gallops,Rubs or new Murmurs, No Parasternal Heave +ve B.Sounds, Abd Soft, No tenderness, No organomegaly appriciated, No rebound - guarding or rigidity. No Cyanosis, Clubbing or edema, No new Rash or bruise      Data Review:    Recent Labs  Lab 09/16/19 1407 09/17/19 0233  WBC 10.5 9.6  HGB 9.1* 10.1*  HCT 28.1* 31.2*  PLT 224 242  MCV 88.1 86.2  MCH 28.5 27.9  MCHC 32.4 32.4  RDW 15.5 15.4  LYMPHSABS 1.9  --   MONOABS 1.2*  --   EOSABS 0.6*  --   BASOSABS 0.2*  --     Recent Labs  Lab 09/16/19 1407 09/17/19 0233  NA 133* 135  K 3.6 3.9  CL 101 102  CO2 22 24  GLUCOSE 101* 107*  BUN 22 16  CREATININE 1.73* 1.30*  CALCIUM 9.1 9.4  AST 20  --  ALT 10  --   ALKPHOS 62  --   BILITOT 0.7  --   ALBUMIN 3.0*  --   MG  --  1.6*  INR 1.2  --   TSH  --  0.096*  HGBA1C  --  6.6*    Recent Labs  Lab 09/16/19 1521  SARSCOV2NAA NEGATIVE    ------------------------------------------------------------------------------------------------------------------ Recent Labs    09/17/19 0233  CHOL 151  HDL 41  LDLCALC 97  TRIG 63  CHOLHDL 3.7    Lab Results  Component Value Date   HGBA1C 6.6 (H) 09/17/2019   ------------------------------------------------------------------------------------------------------------------ Recent Labs    09/17/19 0233  TSH 0.096*   ------------------------------------------------------------------------------------------------------------------ Recent Labs    09/17/19 0233  VITAMINB12 246  FOLATE 10.8  FERRITIN 933*   TIBC 151*  IRON 62    Coagulation profile Recent Labs  Lab 09/16/19 1407  INR 1.2    No results for input(s): DDIMER in the last 72 hours.  Cardiac Enzymes No results for input(s): CKMB, TROPONINI, MYOGLOBIN in the last 168 hours.  Invalid input(s): CK ------------------------------------------------------------------------------------------------------------------ No results found for: BNP  Micro Results Recent Results (from the past 240 hour(s))  SARS Coronavirus 2 by RT PCR (hospital order, performed in [facility] hospital lab) Nasopharyngeal Nasopharyngeal Swab     Status: None   Collection Time: 09/16/19  3:21 PM   Specimen: Nasopharyngeal Swab  Result Value Ref Range Status   SARS Coronavirus 2 NEGATIVE NEGATIVE Final    Comment: (NOTE) SARS-CoV-2 target nucleic acids are NOT DETECTED.  The SARS-CoV-2 RNA is generally detectable in upper and lower respiratory specimens during the acute phase of infection. The lowest concentration of SARS-CoV-2 viral copies this assay can detect is 250 copies / mL. A negative result does not preclude SARS-CoV-2 infection and should not be used as the sole basis for treatment or other patient management decisions.  A negative result may occur with improper specimen collection / handling, submission of specimen other than nasopharyngeal swab, presence of viral mutation(s) within the areas targeted by this assay, and inadequate number of viral copies (<250 copies / mL). A negative result must be combined with clinical observations, patient history, and epidemiological information.  Fact Sheet for Patients:   StrictlyIdeas.no  Fact Sheet for Healthcare Providers: BankingDealers.co.za  This test is not yet approved or  cleared by the [place] FDA and has been authorized for detection and/or diagnosis of SARS-CoV-2 by FDA under an Emergency Use Authorization (EUA).  This EUA will  remain in effect (meaning this test can be used) for the duration of the COVID-19 declaration under Section 564(b)(1) of the Act, 21 U.S.C. section 360bbb-3(b)(1), unless the authorization is terminated or revoked sooner.  Performed at [facility], [address], [place], [place] [address]     Radiology Reports CT Head Wo Contrast  Result Date: 09/16/2019 CLINICAL DATA:  Slurred speech since this morning. Left shoulder pain for a few weeks. EXAM: CT HEAD WITHOUT CONTRAST TECHNIQUE: Contiguous axial images were obtained from the base of the skull through the vertex without intravenous contrast. COMPARISON:  November 24, 2017 FINDINGS: Brain: No subdural, epidural, or subarachnoid hemorrhage. Ventricles and sulci are prominent but stable. Chronic white matter changes are identified. No acute cortical ischemia or infarct. No mass effect or midline shift. Cerebellum, brainstem, and basal cisterns are normal. Vascular: Calcified atherosclerosis is seen in the intracranial carotids. Skull: Normal. Negative for fracture or focal lesion. Sinuses/Orbits: No acute finding. Other: None. IMPRESSION:  No acute intracranial abnormalities identified. Chronic white matter changes. Electronically Signed   By: Gerome Sam III M.D   On: 09/16/2019 14:38   VAS US CAROTID (at [facility] and [facility] only)  Result Date: 09/17/2019 Carotid Arterial Duplex Study Indications:       Speech disturbance. Risk Factors:      Hypertension, hyperlipidemia, Diabetes. Other Factors:     Alzheimer's. Comparison Study:  No prior study on file for comparison Performing Technologist: Sherren Kerns RVS  Examination Guidelines: A complete evaluation includes B-mode imaging, spectral Doppler, color Doppler, and power Doppler as needed of all accessible portions of each vessel. Bilateral testing is considered an integral part of a complete examination. Limited examinations for reoccurring indications may be performed as noted.  Right  Carotid Findings: +----------+--------+--------+--------+------------------+------------------+           PSV cm/sEDV cm/sStenosisPlaque DescriptionComments           +----------+--------+--------+--------+------------------+------------------+ CCA Prox  57      13                                                   +----------+--------+--------+--------+------------------+------------------+ CCA Distal87      12                                intimal thickening +----------+--------+--------+--------+------------------+------------------+ ICA Prox  61      16              heterogenous                         +----------+--------+--------+--------+------------------+------------------+ ICA Distal71      22                                                   +----------+--------+--------+--------+------------------+------------------+ ECA       62      16                                                   +----------+--------+--------+--------+------------------+------------------+ +----------+--------+-------+--------+-------------------+           PSV cm/sEDV cmsDescribeArm Pressure (mmHG) +----------+--------+-------+--------+-------------------+ Subclavian119                                        +----------+--------+-------+--------+-------------------+ +---------+--------+--+--------+--+ VertebralPSV cm/s78EDV cm/s10 +---------+--------+--+--------+--+  Left Carotid Findings: +----------+--------+--------+--------+------------------+--------+           PSV cm/sEDV cm/sStenosisPlaque DescriptionComments +----------+--------+--------+--------+------------------+--------+ CCA Prox  104     20                                         +----------+--------+--------+--------+------------------+--------+ CCA Distal70      14                                         +----------+--------+--------+--------+------------------+--------+  ICA Prox  60       16              heterogenous               +----------+--------+--------+--------+------------------+--------+ ICA Distal55      17                                         +----------+--------+--------+--------+------------------+--------+ ECA       24      1                                          +----------+--------+--------+--------+------------------+--------+ +----------+--------+--------+--------+-------------------+           PSV cm/sEDV cm/sDescribeArm Pressure (mmHG) +----------+--------+--------+--------+-------------------+ [id number]                                          +----------+--------+--------+--------+-------------------+ +---------+--------+--+--------+--+ VertebralPSV cm/s70EDV cm/s11 +---------+--------+--+--------+--+   Summary: Right Carotid: The extracranial vessels were near-normal with only minimal wall                thickening or plaque. Left Carotid: The extracranial vessels were near-normal with only minimal wall               thickening or plaque. Vertebrals:  Bilateral vertebral arteries demonstrate antegrade flow. Subclavians: Normal flow hemodynamics were seen in bilateral subclavian              arteries. *See table(s) above for measurements and observations.     Preliminary     Time Spent in minutes  30   Susa Raring M.D on 09/17/2019 at 10:20 AM  To page go to [email or web address] - password [facility]

## 2019-09-17 NOTE — Progress Notes (Signed)
12 lead EKG done 

## 2019-09-17 NOTE — Progress Notes (Signed)
Urine culture sent to lab.

## 2019-09-17 NOTE — Progress Notes (Signed)
VASCULAR LAB    Carotid duplex completed.    Preliminary report:  See CV proc for preliminary results.  Charlotte Fidalgo, RVT 09/17/2019, 9:25 AM

## 2019-09-17 NOTE — Progress Notes (Signed)
CRITICAL VALUE ALERT  Critical Value:  Troponin 264  Date & Time Notied:  09/17/19 @ 1242  Provider Notified: Dr Thedore Mins paged & aware  Orders Received/Actions taken: no new orders

## 2019-09-17 NOTE — Progress Notes (Signed)
Patient remains bradycardic. HR occasionally in the high 40's. Patient asymptomatic. Will continue to monitor

## 2019-09-18 ENCOUNTER — Other Ambulatory Visit: Payer: Self-pay | Admitting: Student

## 2019-09-18 DIAGNOSIS — I6340 Cerebral infarction due to embolism of unspecified cerebral artery: Secondary | ICD-10-CM | POA: Insufficient documentation

## 2019-09-18 DIAGNOSIS — I5021 Acute systolic (congestive) heart failure: Secondary | ICD-10-CM | POA: Insufficient documentation

## 2019-09-18 DIAGNOSIS — R001 Bradycardia, unspecified: Secondary | ICD-10-CM | POA: Insufficient documentation

## 2019-09-18 DIAGNOSIS — I6310 Cerebral infarction due to embolism of unspecified precerebral artery: Secondary | ICD-10-CM

## 2019-09-18 LAB — CBC WITH DIFFERENTIAL/PLATELET
Abs Immature Granulocytes: 0.75 10*3/uL — ABNORMAL HIGH (ref 0.00–0.07)
Basophils Absolute: 0.3 10*3/uL — ABNORMAL HIGH (ref 0.0–0.1)
Basophils Relative: 3 %
Eosinophils Absolute: 0.8 10*3/uL — ABNORMAL HIGH (ref 0.0–0.5)
Eosinophils Relative: 7 %
HCT: 30.6 % — ABNORMAL LOW (ref 36.0–46.0)
Hemoglobin: 10 g/dL — ABNORMAL LOW (ref 12.0–15.0)
Immature Granulocytes: 7 %
Lymphocytes Relative: 19 %
Lymphs Abs: 2.1 10*3/uL (ref 0.7–4.0)
MCH: 28.1 pg (ref 26.0–34.0)
MCHC: 32.7 g/dL (ref 30.0–36.0)
MCV: 86 fL (ref 80.0–100.0)
Monocytes Absolute: 1 10*3/uL (ref 0.1–1.0)
Monocytes Relative: 9 %
Neutro Abs: 6.2 10*3/uL (ref 1.7–7.7)
Neutrophils Relative %: 55 %
Platelets: 220 10*3/uL (ref 150–400)
RBC: 3.56 MIL/uL — ABNORMAL LOW (ref 3.87–5.11)
RDW: 15.7 % — ABNORMAL HIGH (ref 11.5–15.5)
WBC: 11.3 10*3/uL — ABNORMAL HIGH (ref 4.0–10.5)
nRBC: 0 % (ref 0.0–0.2)

## 2019-09-18 LAB — URINE CULTURE: Culture: 20000 — AB

## 2019-09-18 LAB — COMPREHENSIVE METABOLIC PANEL
ALT: 10 U/L (ref 0–44)
AST: 20 U/L (ref 15–41)
Albumin: 2.3 g/dL — ABNORMAL LOW (ref 3.5–5.0)
Alkaline Phosphatase: 58 U/L (ref 38–126)
Anion gap: 9 (ref 5–15)
BUN: 14 mg/dL (ref 8–23)
CO2: 20 mmol/L — ABNORMAL LOW (ref 22–32)
Calcium: 8.9 mg/dL (ref 8.9–10.3)
Chloride: 103 mmol/L (ref 98–111)
Creatinine, Ser: 1.2 mg/dL — ABNORMAL HIGH (ref 0.44–1.00)
GFR calc Af Amer: 48 mL/min — ABNORMAL LOW (ref >60)
GFR calc non Af Amer: 41 mL/min — ABNORMAL LOW (ref >60)
Glucose, Bld: 88 mg/dL (ref 70–99)
Potassium: 3.7 mmol/L (ref 3.5–5.1)
Sodium: 132 mmol/L — ABNORMAL LOW (ref 135–145)
Total Bilirubin: 0.6 mg/dL (ref 0.3–1.2)
Total Protein: 6.1 g/dL — ABNORMAL LOW (ref 6.5–8.1)

## 2019-09-18 LAB — BRAIN NATRIURETIC PEPTIDE: B Natriuretic Peptide: 428.2 pg/mL — ABNORMAL HIGH (ref 0.0–100.0)

## 2019-09-18 LAB — MAGNESIUM: Magnesium: 1.6 mg/dL — ABNORMAL LOW (ref 1.7–2.4)

## 2019-09-18 LAB — TSH: TSH: 0.086 u[IU]/mL — ABNORMAL LOW (ref 0.350–4.500)

## 2019-09-18 LAB — T4, FREE: Free T4: 1.51 ng/dL — ABNORMAL HIGH (ref 0.61–1.12)

## 2019-09-18 MED ORDER — MAGNESIUM SULFATE 2 GM/50ML IV SOLN
2.0000 g | Freq: Once | INTRAVENOUS | Status: AC
  Administered 2019-09-18: 2 g via INTRAVENOUS
  Filled 2019-09-18: qty 50

## 2019-09-18 MED ORDER — ISOSORB DINITRATE-HYDRALAZINE 20-37.5 MG PO TABS
1.0000 | ORAL_TABLET | Freq: Three times a day (TID) | ORAL | Status: DC
  Administered 2019-09-18 – 2019-09-19 (×3): 1 via ORAL
  Filled 2019-09-18 (×2): qty 1

## 2019-09-18 NOTE — Progress Notes (Signed)
.  eve

## 2019-09-18 NOTE — Addendum Note (Signed)
Addended by: Maxine Glenn A on: 09/18/2019 11:28 AM   Modules accepted: Orders

## 2019-09-18 NOTE — Progress Notes (Signed)
STROKE TEAM PROGRESS NOTE       INTERVAL HISTORY She reports she is not having a good day because of pain.  She denies any recurrent stroke or TIA symptoms.  MRI scan of the brain is negative for acute infarct.  Echocardiogram shows ejection fraction of 35 to 40%.  Carotid ultrasound shows no significant extracranial stenosis.  LDL cholesterol 97 mg percent.  Hemoglobin A1c 6.6.   OBJECTIVE Vitals:   09/18/19 0350 09/18/19 0729 09/18/19 1152 09/18/19 1300  BP: (!) 116/54 (!) 131/59 (!) 143/61 105/74  Pulse: (!) 50 (!) 49    Resp: 17 13 13    Temp: 98 F (36.7 C) 97.6 F (36.4 C) 97.7 F (36.5 C) 97.9 F (36.6 C)  TempSrc: Oral Oral Oral Rectal  SpO2: 100%     Weight:        CBC:  Recent Labs  Lab 09/16/19 1407 09/16/19 1407 09/17/19 0233 09/18/19 0234  WBC 10.5   < > 9.6 11.3*  NEUTROABS 5.8  --   --  6.2  HGB 9.1*   < > 10.1* 10.0*  HCT 28.1*   < > 31.2* 30.6*  MCV 88.1   < > 86.2 86.0  PLT 224   < > 242 220   < > = values in this interval not displayed.    Basic Metabolic Panel:  Recent Labs  Lab 09/17/19 0233 09/18/19 0234  NA 135 132*  K 3.9 3.7  CL 102 103  CO2 24 20*  GLUCOSE 107* 88  BUN 16 14  CREATININE 1.30* 1.20*  CALCIUM 9.4 8.9  MG 1.6* 1.6*    Lipid Panel:     Component Value Date/Time   CHOL 151 09/17/2019 0233   TRIG 63 09/17/2019 0233   HDL 41 09/17/2019 0233   CHOLHDL 3.7 09/17/2019 0233   VLDL 13 09/17/2019 0233   LDLCALC 97 09/17/2019 0233   HgbA1c:  Lab Results  Component Value Date   HGBA1C 6.6 (H) 09/17/2019   Urine Drug Screen:     Component Value Date/Time   LABOPIA NONE DETECTED 09/16/2019 1541   COCAINSCRNUR NONE DETECTED 09/16/2019 1541   LABBENZ NONE DETECTED 09/16/2019 1541   AMPHETMU NONE DETECTED 09/16/2019 1541   THCU NONE DETECTED 09/16/2019 1541   LABBARB NONE DETECTED 09/16/2019 1541    Alcohol Level     Component Value Date/Time   ETH <10 09/16/2019 1407    IMAGING  CT Head Wo  Contrast 09/16/2019 IMPRESSION:  No acute intracranial abnormalities identified. Chronic white matter changes.   MRI Brain WO Contrast -  09/17/19 IMPRESSION: 1. No acute intracranial abnormality or significant interval change. 2. Stable moderate atrophy and white matter disease. This likely reflects the sequela of chronic microvascular ischemia. 3. Heterogeneous marrow signal in the cervical spine. While this may be secondary to degenerative disease, metastatic disease or melanoma is not excluded. Recommend clinical correlation.    VAS 09/19/19 CAROTID (at [facility] and [facility] only) 09/17/2019 Summary:  Right Carotid: The extracranial vessels were near-normal with only minimal wall thickening or plaque.  Left Carotid: The extracranial vessels were near-normal with only minimal wall thickening or plaque. Vertebrals:   Bilateral vertebral arteries: demonstrate antegrade flow.  Subclavians: Normal flow hemodynamics were seen in bilateral subclavian arteries.   Transthoracic Echocardiogram  00/00/2021 Pending  ECG - SR rate 67 BPM. (computer read as acute MI) (See cardiology reading for complete details)   PHYSICAL EXAM Blood pressure 105/74, pulse (!) 49, temperature 97.9 F (36.6  C), temperature source Rectal, resp. rate 13, weight 50.8 kg, SpO2 100 %. Pleasant elderly African-American lady not in distress.  . Afebrile. Head is nontraumatic. Neck is supple without bruit.    Cardiac exam no murmur or gallop. Lungs are clear to auscultation. Distal pulses are well felt. Neurological Exam ;  Awake  Alert oriented x 3. Normal speech and language.mildly diminished attention, registration and recall eye movements full without nystagmus.fundi were not visualized. Vision acuity and fields appear normal. Hearing is normal. Palatal movements are normal. Face symmetric. Tongue midline. Normal strength, tone, reflexes and coordination. Normal sensation. Gait deferred.          ASSESSMENT/PLAN Ms.  Aleane Wesenberg is a 86 y.o. female with history of Alzheimer's dementia, HTN, DM, hypothyroidism, HLD and dark stools recently with drop in her Hgb to 9.5 from 13 in March presenting with slurred speech and word finding difficulty.  She did not receive IV t-PA due to late presentation (>4.5 hours from time of onset)  Suspect stroke vs TIA: MRI pending  Resultant back to baseline.  Code Stroke CT Head -      CT head - No acute intracranial abnormalities identified. Chronic white matter changes.   MRI head -no acute infarct   MRA head - not ordered  CTA H&N - not ordered  CT Perfusion - not ordered  Carotid Doppler - unremarkable  2D Echo -diminished ejection fraction of 35 to 40% [employer] Virus 2 - negative  LDL - 97  HgbA1c - 6.6  UDS - negative  VTE prophylaxis - [place] Heparin Diet  Diet Order            Diet heart healthy/carb modified Room service appropriate? Yes with Assist; Fluid consistency: Thin  Diet effective now                 No antithrombotic prior to admission, now on aspirin 81 mg daily.  We will not do dual antiplatelet therapy as given her Alzheimer's dementia unsure results she is a good long-term candidate for anticoagulation  Patient counseled to be compliant with her antithrombotic medications  Ongoing aggressive stroke risk factor management  Therapy recommendations:  pending  Disposition:  Pending  Hypertension  Home BP meds: Diltiazem ; HCTC ; Avapro  Current BP meds: none   BP mildly low . Permissive hypertension (OK if < 220/120) but gradually normalize in 5-7 days . Long-term BP goal normotensive  Hyperlipidemia  Home Lipid lowering medication: Mevacor 20 mg daily  LDL 97, goal < 70  Current lipid lowering medication: Consider increasing Mevacor dose or change to more potent statin  Continue statin at discharge  Diabetes  Home diabetic meds: none   Current diabetic meds: none  HgbA1c 6.6, goal < 7.0 Recent Labs     09/16/19 1316  GLUCAP 90    Other Stroke Risk Factors  Advanced age  Other Active Problems  Code status - DNR  Anemia - Hgb - 10.1  CKD - stage 3b  - creatinine - 1.73->1.30  Bradycardia - asx  TSH low - 0.096  Abnormal ECG - check troponin   Hypomagnesemia - 1.6  Hospital day # 2 She presented with transient expressive aphasia likely left hemispheric TIA.  She has diminished ejection fraction on the echo but no known prior cardiac history.  Consulted EP team for loop recorder but they want a formal cardiology consultation given her abnormal echo and will likely have 30-day heart monitor and if negative do  loop recorder later as an outpatient.  Recommend aspirin for now and cardiology to follow and do cardiac work-up to decide if she has A. fib in that case may consider anticoagulation.  Continue ongoing stroke work-up and aggressive risk factor modification.  Greater than 50% time during this 25-minute visit was spent on counseling and coordination of care and discussion with care team.  Discussed with Dr. Candiss Norse.  Stroke team will sign off.  Follow-up as an outpatient stroke clinic in 6 weeks. Antony Contras, MD To contact Stroke Continuity provider, please refer to [email or web address] After hours, contact General Neurology

## 2019-09-18 NOTE — TOC Initial Note (Signed)
Transition of Care [facility]) - Initial/Assessment Note    Patient Details  Name: Andrea Warner MRN: [id number] Date of Birth: [birth year]  Transition of Care [facility] CM/SW Contact:    Benard Halsted, LCSW Phone Number: 09/18/2019, 12:25 PM  Clinical Narrative:                 CSW received consult for possible SNF placement at time of discharge. CSW spoke with patient regarding PT recommendation of SNF placement at time of discharge. Patient is declining SNF and provides permission to speak with patient's son. Patient reported that patient's son is currently able to assist her at home and she requests home health services. She states she uses a walker at home. Son reports he would like for patient to stay with him, though patient states she prefers to remain at her own home. CSW will follow up with patient's son to confirm address/PCP for home health services.  No further questions reported at this time.    Expected Discharge Plan: Skilled Nursing Facility Barriers to Discharge: Continued Medical Work up   Patient Goals and CMS Choice Patient states their goals for this hospitalization and ongoing recovery are:: Return home CMS Medicare.gov Compare Post Acute Care list provided to:: Patient Choice offered to / list presented to : Patient, Adult Children  Expected Discharge Plan and Services Expected Discharge Plan: [place] In-house Referral: Clinical Social Work   Post Acute Care Choice: [place] arrangements for the past 2 months: [place]                                      Prior Living Arrangements/Services Living arrangements for the past 2 months: Single Family Home Lives with:: Self Patient language and need for interpreter reviewed:: Yes Do you feel safe going back to the place where you live?: Yes      Need for Family Participation in Patient Care: Yes (Comment) Care giver support system in place?: Yes (comment) Current home  services: DME Criminal Activity/Legal Involvement Pertinent to Current Situation/Hospitalization: No - Comment as needed  Activities of Daily Living      Permission Sought/Granted Permission sought to share information with : Facility Sport and exercise psychologist, Family Supports Permission granted to share information with : Yes, Verbal Permission Granted  Share Information with NAME: Andrea Warner  Permission granted to share info w AGENCY: [place] granted to share info w Relationship: Son  Permission granted to share info w Contact Information: [phone]  Emotional Assessment Appearance:: Appears stated age Attitude/Demeanor/Rapport: Engaged, Gracious Affect (typically observed): Accepting, Appropriate (Forgetful) Orientation: : Oriented to Self, Oriented to Place, Oriented to  Time, Oriented to Situation Alcohol / Substance Use: Not Applicable Psych Involvement: No (comment)  Admission diagnosis:  Aphasia [R47.01] CVA (cerebral vascular accident) ([place]) [I63.9] Cerebrovascular accident (CVA), unspecified mechanism ([place]) [I63.9] Patient Active Problem List   Diagnosis Date Noted  . Cerebral embolism with cerebral infarction 09/18/2019  . Sinus bradycardia 09/18/2019  . Acute systolic heart failure ([place]) 09/18/2019  . Slurred speech 09/16/2019  . Expressive aphasia 09/16/2019  . Alzheimer's dementia ([place])   . Type 2 diabetes mellitus ([place])   . Normocytic anemia   . AKI (acute kidney injury) ([place])   . Hypertension associated with diabetes ([place])   . Hyperlipidemia associated with type 2 diabetes mellitus ([place])   . Hypothyroidism    PCP:  Patient, No Pcp  Per Pharmacy:   [id number] - [place], [place] - [address] [place] [address] Phone: [phone] Fax: [phone]     Social Determinants of Health (SDOH) Interventions    Readmission Risk Interventions No flowsheet data found.

## 2019-09-18 NOTE — Progress Notes (Addendum)
PROGRESS NOTE                                                                                                                                                                                                             Patient Demographics:    Andrea Warner, is a 86 y.o. female, DOB - Dec 31, 1931, NIO:[id number]  Admit date - 09/16/2019   Admitting Physician Charlsie Quest, MD  Outpatient Primary MD for the patient is Patient, No Pcp Per  LOS - 2  Chief Complaint  Patient presents with  . Slurred speech       Brief Narrative  Andrea Warner is a 86 y.o. female with medical history significant for Alzheimer dementia, type 2 diabetes, hypertension, hyperlipidemia, hypothyroidism, and depression who presents to the ED for evaluation of slurred speech.   Subjective:   Patient in bed, appears comfortable, denies any headache, no fever, no chest pain or pressure, no shortness of breath , no abdominal pain. No focal weakness.    Assessment  & Plan :     1.  TIA .  Left-sided arm weakness has resolved and speech is close to normal now, MRI and CT nonacute, complete stroke work-up underway.  On aspirin and statin for now.  Case discussed with neurologist Dr. Pearlean Brownie, plan is outpatient cardiology work-up for depressed EF and evaluation for Holter monitor versus loop recorder and outpatient neurology follow-up.  Lab Results  Component Value Date   HGBA1C 6.6 (H) 09/17/2019   Lab Results  Component Value Date   CHOL 151 09/17/2019   HDL 41 09/17/2019   LDLCALC 97 09/17/2019   TRIG 63 09/17/2019   CHOLHDL 3.7 09/17/2019    2.  Mild early Alzheimer's dementia.  Still quite functional and drives, at risk for delirium, family informed, minimize benzos and narcotics.  Will resume Aricept if bradycardia resolves.  3.  Essential hypertension.  Allow for permissive hypertension for now.  4.  Dyslipidemia.  LDL stable on present dose statin.  5.  Hypothyroidism.  Synthroid dose held  for 3 days, lowered the dose thereafter as TSH was suppressed.  We will also check free T4 and T3.  6.  Bradycardia at rest.  With stable blood pressures.  Hold diltiazem and Aricept and monitor.  7.  Dehydration, AKI.  IV fluids and monitor.   8.  Hypomagnesemia.  Replace.   9.  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  Currently compensated, on aspirin, will  add low-dose BiDil if blood pressure can tolerate, renal function is poor and precludes the use of ACE/ARB or Entresto. No  Beta-blocker due to moderate to severe bradycardia, outpatient cardiology follow-up.  10. DM type II.  Diet controlled, A1c acceptable for her age.  On Glucophage upon discharge.   CBG (last 3)  Recent Labs    09/16/19 1316  GLUCAP 90     Condition -  Guarded  Family Communication  : Tempie Donning [phone] on 09/17/2019, 09/18/2019  Code Status :  DNR  Consults  :  Neuro  Procedures  :    CT - Non acute  MRI - No Acute CVA  TTE - 1. Left ventricular ejection fraction, by estimation, is 35 to 40%. The left ventricle has moderately decreased function. The left ventricle has no regional wall motion abnormalities. Left ventricular diastolic parameters are consistent with Grade I diastolic dysfunction (impaired relaxation). There is akinesis of the left ventricular, basal-mid anteroseptal wall, inferoseptal wall and inferior wall.  2. Right ventricular systolic function is normal. The right ventricular size is normal. There is normal pulmonary artery systolic pressure.  3. The mitral valve is normal in structure. Mild mitral valve regurgitation. No evidence of mitral stenosis.  4. The aortic valve is tricuspid. Aortic valve regurgitation is mild. Mild to moderate aortic valve sclerosis/calcification is present, without any evidence of aortic stenosis. Aortic regurgitation PHT measures 654 msec.  5. The inferior vena cava is normal in size with greater than 50%  respiratory variability, suggesting right atrial pressure of 3 mmHg.  PUD Prophylaxis : None  Disposition Plan  :    Status is: Inpatient  Remains inpatient appropriate because:Ongoing diagnostic testing needed not appropriate for outpatient work up   Dispo: The patient is from: Home              Anticipated d/c is to: [facility], discussed with son he would evaluate his mother tomorrow morning if she looks stable to him he will take her home with home health if any further decline then SNF will be pursued.  Note for now she lives alone.              Anticipated d/c date is: 1 days              Patient currently is not medically stable to d/c.  DVT Prophylaxis  :   Heparin    Lab Results  Component Value Date   PLT 220 09/18/2019    Diet :  Diet Order            Diet heart healthy/carb modified Room service appropriate? Yes with Assist; Fluid consistency: Thin  Diet effective now                  Inpatient Medications Scheduled Meds: . aspirin EC  81 mg Oral Daily  . cyanocobalamin  1,000 mcg Intramuscular Daily  . heparin  5,000 Units Subcutaneous Q8H  . [START ON 09/21/2019] levothyroxine  50 mcg Oral QAC breakfast  . pravastatin  20 mg Oral q1800  . [START ON 09/21/2019] vitamin B-12  1,000 mcg Oral Daily   Continuous Infusions: . magnesium sulfate bolus IVPB     PRN Meds:.acetaminophen **OR** [DISCONTINUED] acetaminophen (TYLENOL) oral liquid 160 mg/5 mL **OR** [DISCONTINUED] acetaminophen, melatonin, senna-docusate  Antibiotics  :   Anti-infectives (From admission, onward)   None          Objective:   Vitals:   09/17/19 2012 09/17/19 2357  09/18/19 0350 09/18/19 0729  BP: (!) 124/55 111/78 (!) 116/54 (!) 131/59  Pulse: (!) 48 69 (!) 50 (!) 49  Resp: 16 20 17 13   Temp: 97.6 F (36.4 C) 97.9 F (36.6 C) 98 F (36.7 C) 97.6 F (36.4 C)  TempSrc: Axillary Axillary Oral Oral  SpO2: 94% 93% 100%   Weight:        SpO2: 100 %  Wt Readings from Last 3  Encounters:  09/16/19 50.8 kg  10/16/14 75.8 kg     Intake/Output Summary (Last 24 hours) at 09/18/2019 1047 Last data filed at 09/18/2019 1028 Gross per 24 hour  Intake 1598.29 ml  Output 575 ml  Net 1023.29 ml     Physical Exam  Awake Alert,mildly confused, speech near normal, mild L arm weakness [place].AT,PERRAL Supple Neck,No JVD, No cervical lymphadenopathy appriciated.  Symmetrical Chest wall movement, Good air movement bilaterally, CTAB RRR,No Gallops,Rubs or new Murmurs, No Parasternal Heave +ve B.Sounds, Abd Soft, No tenderness, No organomegaly appriciated, No rebound - guarding or rigidity. No Cyanosis, Clubbing or edema, No new Rash or bruise      Data Review:    Recent Labs  Lab 09/16/19 1407 09/17/19 0233 09/18/19 0234  WBC 10.5 9.6 11.3*  HGB 9.1* 10.1* 10.0*  HCT 28.1* 31.2* 30.6*  PLT 224 242 220  MCV 88.1 86.2 86.0  MCH 28.5 27.9 28.1  MCHC 32.4 32.4 32.7  RDW 15.5 15.4 15.7*  LYMPHSABS 1.9  --  2.1  MONOABS 1.2*  --  1.0  EOSABS 0.6*  --  0.8*  BASOSABS 0.2*  --  0.3*    Recent Labs  Lab 09/16/19 1407 09/17/19 0233 09/18/19 0234  NA 133* 135 132*  K 3.6 3.9 3.7  CL 101 102 103  CO2 22 24 20*  GLUCOSE 101* 107* 88  BUN 22 16 14   CREATININE 1.73* 1.30* 1.20*  CALCIUM 9.1 9.4 8.9  AST 20  --  20  ALT 10  --  10  ALKPHOS 62  --  58  BILITOT 0.7  --  0.6  ALBUMIN 3.0*  --  2.3*  MG  --  1.6* 1.6*  INR 1.2  --   --   TSH  --  0.096*  --   HGBA1C  --  6.6*  --   BNP  --   --  428.2*    Recent Labs  Lab 09/16/19 1521 09/18/19 0234  BNP  --  428.2*  SARSCOV2NAA NEGATIVE  --     ------------------------------------------------------------------------------------------------------------------ Recent Labs    09/17/19 0233  CHOL 151  HDL 41  LDLCALC 97  TRIG 63  CHOLHDL 3.7    Lab Results  Component Value Date   HGBA1C 6.6 (H) 09/17/2019    ------------------------------------------------------------------------------------------------------------------ Recent Labs    09/17/19 0233  TSH 0.096*   ------------------------------------------------------------------------------------------------------------------ Recent Labs    09/17/19 0233  VITAMINB12 246  FOLATE 10.8  FERRITIN 933*  TIBC 151*  IRON 62    Coagulation profile Recent Labs  Lab 09/16/19 1407  INR 1.2    No results for input(s): DDIMER in the last 72 hours.  Cardiac Enzymes No results for input(s): CKMB, TROPONINI, MYOGLOBIN in the last 168 hours.  Invalid input(s): CK ------------------------------------------------------------------------------------------------------------------    Component Value Date/Time   BNP 428.2 (H) 09/18/2019 0234    Micro Results Recent Results (from the past 240 hour(s))  SARS Coronavirus 2 by RT PCR (hospital order, performed in [facility] hospital lab) Nasopharyngeal Nasopharyngeal Swab  Status: None   Collection Time: 09/16/19  3:21 PM   Specimen: Nasopharyngeal Swab  Result Value Ref Range Status   SARS Coronavirus 2 NEGATIVE NEGATIVE Final    Comment: (NOTE) SARS-CoV-2 target nucleic acids are NOT DETECTED.  The SARS-CoV-2 RNA is generally detectable in upper and lower respiratory specimens during the acute phase of infection. The lowest concentration of SARS-CoV-2 viral copies this assay can detect is 250 copies / mL. A negative result does not preclude SARS-CoV-2 infection and should not be used as the sole basis for treatment or other patient management decisions.  A negative result may occur with improper specimen collection / handling, submission of specimen other than nasopharyngeal swab, presence of viral mutation(s) within the areas targeted by this assay, and inadequate number of viral copies (<250 copies / mL). A negative result must be combined with clinical observations, patient  history, and epidemiological information.  Fact Sheet for Patients:   BoilerBrush.com.cy  Fact Sheet for Healthcare Providers: https://pope.com/  This test is not yet approved or  cleared by the [place] FDA and has been authorized for detection and/or diagnosis of SARS-CoV-2 by FDA under an Emergency Use Authorization (EUA).  This EUA will remain in effect (meaning this test can be used) for the duration of the COVID-19 declaration under Section 564(b)(1) of the Act, 21 U.S.C. section 360bbb-3(b)(1), unless the authorization is terminated or revoked sooner.  Performed at [facility], [address] [place], [place] [address]   Culture, Urine     Status: Abnormal   Collection Time: 09/16/19  9:30 PM   Specimen: Urine, Random  Result Value Ref Range Status   Specimen Description URINE, RANDOM  Final   Special Requests   Final    NONE Performed at [facility], [address], [place], [place] [address]    Culture (A)  Final    20,000 COLONIES/mL MULTIPLE SPECIES PRESENT, SUGGEST RECOLLECTION   Report Status 09/18/2019 FINAL  Final    Radiology Reports CT Head Wo Contrast  Result Date: 09/16/2019 CLINICAL DATA:  Slurred speech since this morning. Left shoulder pain for a few weeks. EXAM: CT HEAD WITHOUT CONTRAST TECHNIQUE: Contiguous axial images were obtained from the base of the skull through the vertex without intravenous contrast. COMPARISON:  November 24, 2017 FINDINGS: Brain: No subdural, epidural, or subarachnoid hemorrhage. Ventricles and sulci are prominent but stable. Chronic white matter changes are identified. No acute cortical ischemia or infarct. No mass effect or midline shift. Cerebellum, brainstem, and basal cisterns are normal. Vascular: Calcified atherosclerosis is seen in the intracranial carotids. Skull: Normal. Negative for fracture or focal lesion. Sinuses/Orbits: No acute finding. Other:  None. IMPRESSION: No acute intracranial abnormalities identified. Chronic white matter changes. Electronically Signed   By: Gerome Sam III M.D   On: 09/16/2019 14:38   MR BRAIN WO CONTRAST  Result Date: 09/17/2019 CLINICAL DATA:  Neuro deficit. Acute stroke suspected. Altered mental status, confusion. Baseline dementia. EXAM: MRI HEAD WITHOUT CONTRAST TECHNIQUE: Multiplanar, multiecho pulse sequences of the brain and surrounding structures were obtained without intravenous contrast. COMPARISON:  CT head without contrast 09/16/2019. MRI head without contrast 07/13/2017 at [facility] FINDINGS: Brain: Allowing for differences in magnets, moderate periventricular and subcortical T2 hyperintensities are stable. Moderate atrophy is stable. The ventricles are proportionate to the degree of atrophy. No acute infarct, hemorrhage, or mass lesion is present. No significant extraaxial fluid collection is present. The internal auditory canals are  within normal limits. The brainstem and cerebellum are within normal limits. Vascular: Flow is present in the major intracranial arteries. Skull and upper cervical spine: The craniocervical junction is normal. Heterogeneous marrow signal is present in the cervical spine. While this may be secondary to degenerative disease, metastatic disease or melanoma is not excluded. Marrow signal in the calvarium appears to be normal. Sinuses/Orbits: Minimal mucosal thickening is present within anterior ethmoid air cells. Sinuses are otherwise clear. Bilateral lens replacements are noted. IMPRESSION: 1. No acute intracranial abnormality or significant interval change. 2. Stable moderate atrophy and white matter disease. This likely reflects the sequela of chronic microvascular ischemia. 3. Heterogeneous marrow signal in the cervical spine. While this may be secondary to degenerative disease, metastatic disease or melanoma is not excluded. Recommend clinical  correlation. Electronically Signed   By: Marin Roberts M.D.   On: 09/17/2019 14:38   ECHOCARDIOGRAM COMPLETE  Result Date: 09/17/2019    ECHOCARDIOGRAM REPORT   Patient Name:   Andrea Warner Date of Exam: 09/17/2019 Medical Rec #:  [id number]  Height:       63.0 in Accession #:    [id number] Weight:       112.0 lb Date of Birth:  1931/06/29 BSA:          1.511 m Patient Age:    87 years   BP:           108/47 mmHg Patient Gender: F          HR:           50 bpm. Exam Location:  Inpatient Procedure: 2D Echo Indications:    stroke 434.91  History:        Patient has no prior history of Echocardiogram examinations.                 Alzheimers; Risk Factors:Diabetes, Hypertension and                 Dyslipidemia.  Sonographer:    Delcie Roch Referring Phys: [id number] VISHAL R PATEL  Sonographer Comments: Image acquisition challenging due to respiratory motion. IMPRESSIONS  1. Left ventricular ejection fraction, by estimation, is 35 to 40%. The left ventricle has moderately decreased function. The left ventricle has no regional wall motion abnormalities. Left ventricular diastolic parameters are consistent with Grade I diastolic dysfunction (impaired relaxation). There is akinesis of the left ventricular, basal-mid anteroseptal wall, inferoseptal wall and inferior wall.  2. Right ventricular systolic function is normal. The right ventricular size is normal. There is normal pulmonary artery systolic pressure.  3. The mitral valve is normal in structure. Mild mitral valve regurgitation. No evidence of mitral stenosis.  4. The aortic valve is tricuspid. Aortic valve regurgitation is mild. Mild to moderate aortic valve sclerosis/calcification is present, without any evidence of aortic stenosis. Aortic regurgitation PHT measures 654 msec.  5. The inferior vena cava is normal in size with greater than 50% respiratory variability, suggesting right atrial pressure of 3 mmHg. FINDINGS  Left Ventricle: Left ventricular  ejection fraction, by estimation, is 35 to 40%. The left ventricle has moderately decreased function. The left ventricle has no regional wall motion abnormalities. The left ventricular internal cavity size was normal in size. There is no left ventricular hypertrophy. Abnormal (paradoxical) septal motion, consistent with left bundle branch block. Left ventricular diastolic parameters are consistent with Grade I diastolic dysfunction (impaired relaxation). Normal left ventricular filling pressure. Right Ventricle: The right ventricular size is normal. No increase in right ventricular  wall thickness. Right ventricular systolic function is normal. There is normal pulmonary artery systolic pressure. The tricuspid regurgitant velocity is 1.75 m/s, and  with an assumed right atrial pressure of 3 mmHg, the estimated right ventricular systolic pressure is 15.2 mmHg. Left Atrium: Left atrial size was normal in size. Right Atrium: Right atrial size was normal in size. Pericardium: There is no evidence of pericardial effusion. Mitral Valve: The mitral valve is normal in structure. Normal mobility of the mitral valve leaflets. Mild mitral valve regurgitation. No evidence of mitral valve stenosis. Tricuspid Valve: The tricuspid valve is normal in structure. Tricuspid valve regurgitation is trivial. No evidence of tricuspid stenosis. Aortic Valve: The aortic valve is tricuspid. Aortic valve regurgitation is mild. Aortic regurgitation PHT measures 654 msec. Mild to moderate aortic valve sclerosis/calcification is present, without any evidence of aortic stenosis. Pulmonic Valve: The pulmonic valve was normal in structure. Pulmonic valve regurgitation is not visualized. No evidence of pulmonic stenosis. Aorta: The aortic root is normal in size and structure. Venous: The inferior vena cava is normal in size with greater than 50% respiratory variability, suggesting right atrial pressure of 3 mmHg. IAS/Shunts: No atrial level shunt  detected by color flow Doppler.  LEFT VENTRICLE PLAX 2D LVIDd:         4.70 cm     Diastology LVIDs:         3.80 cm     LV e' lateral:   6.42 cm/s LV PW:         0.90 cm     LV E/e' lateral: 6.7 LV IVS:        0.70 cm     LV e' medial:    4.03 cm/s LVOT diam:     1.70 cm     LV E/e' medial:  10.6 LV SV:         50 LV SV Index:   33 LVOT Area:     2.27 cm  LV Volumes (MOD) LV vol d, MOD A4C: 89.7 ml LV vol s, MOD A4C: 58.1 ml LV SV MOD A4C:     89.7 ml RIGHT VENTRICLE RV S prime:     8.16 cm/s LEFT ATRIUM             Index       RIGHT ATRIUM           Index LA diam:        3.30 cm 2.18 cm/m  RA Area:     10.20 cm LA Vol (A2C):   45.0 ml 29.78 ml/m RA Volume:   20.30 ml  13.43 ml/m LA Vol (A4C):   32.1 ml 21.24 ml/m LA Biplane Vol: 38.6 ml 25.54 ml/m  AORTIC VALVE LVOT Vmax:   109.00 cm/s LVOT Vmean:  65.400 cm/s LVOT VTI:    0.220 m AI PHT:      654 msec  AORTA Ao Root diam: 2.70 cm MITRAL VALVE                 TRICUSPID VALVE MV Area (PHT): 1.94 cm      TR Peak grad:   12.2 mmHg MV Decel Time: 391 msec      TR Vmax:        175.00 cm/s MR Peak grad:    94.1 mmHg MR Mean grad:    65.0 mmHg   SHUNTS MR Vmax:         485.00 cm/s Systemic VTI:  0.22 m MR Vmean:  382.0 cm/s  Systemic Diam: 1.70 cm MR PISA:         0.57 cm MR PISA Eff ROA: 5 mm MR PISA Radius:  0.30 cm MV E velocity: 42.80 cm/s MV A velocity: 66.40 cm/s MV E/A ratio:  0.64 Armanda Magicraci Turner MD Electronically signed by Armanda Magicraci Turner MD Signature Date/Time: 09/17/2019/1:02:56 PM    Final    VAS US CAROTID (at [facility] and [facility] only)  Result Date: 09/17/2019 Carotid Arterial Duplex Study Indications:       Speech disturbance. Risk Factors:      Hypertension, hyperlipidemia, Diabetes. Other Factors:     Alzheimer's. Comparison Study:  No prior study on file for comparison Performing Technologist: Sherren Kernsandace Kanady RVS  Examination Guidelines: A complete evaluation includes B-mode imaging, spectral Doppler, color Doppler, and power Doppler as needed of all  accessible portions of each vessel. Bilateral testing is considered an integral part of a complete examination. Limited examinations for reoccurring indications may be performed as noted.  Right Carotid Findings: +----------+--------+--------+--------+------------------+------------------+           PSV cm/sEDV cm/sStenosisPlaque DescriptionComments           +----------+--------+--------+--------+------------------+------------------+ CCA Prox  57      13                                                   +----------+--------+--------+--------+------------------+------------------+ CCA Distal87      12                                intimal thickening +----------+--------+--------+--------+------------------+------------------+ ICA Prox  61      16              heterogenous                         +----------+--------+--------+--------+------------------+------------------+ ICA Distal71      22                                                   +----------+--------+--------+--------+------------------+------------------+ ECA       62      16                                                   +----------+--------+--------+--------+------------------+------------------+ +----------+--------+-------+--------+-------------------+           PSV cm/sEDV cmsDescribeArm Pressure (mmHG) +----------+--------+-------+--------+-------------------+ Subclavian119                                        +----------+--------+-------+--------+-------------------+ +---------+--------+--+--------+--+ VertebralPSV cm/s78EDV cm/s10 +---------+--------+--+--------+--+  Left Carotid Findings: +----------+--------+--------+--------+------------------+--------+           PSV cm/sEDV cm/sStenosisPlaque DescriptionComments +----------+--------+--------+--------+------------------+--------+ CCA Prox  104     20                                          +----------+--------+--------+--------+------------------+--------+  CCA Distal70      14                                         +----------+--------+--------+--------+------------------+--------+ ICA Prox  60      16              heterogenous               +----------+--------+--------+--------+------------------+--------+ ICA Distal55      17                                         +----------+--------+--------+--------+------------------+--------+ ECA       24      1                                          +----------+--------+--------+--------+------------------+--------+ +----------+--------+--------+--------+-------------------+           PSV cm/sEDV cm/sDescribeArm Pressure (mmHG) +----------+--------+--------+--------+-------------------+ [id number]                                          +----------+--------+--------+--------+-------------------+ +---------+--------+--+--------+--+ VertebralPSV cm/s70EDV cm/s11 +---------+--------+--+--------+--+   Summary: Right Carotid: The extracranial vessels were near-normal with only minimal wall                thickening or plaque. Left Carotid: The extracranial vessels were near-normal with only minimal wall               thickening or plaque. Vertebrals:  Bilateral vertebral arteries demonstrate antegrade flow. Subclavians: Normal flow hemodynamics were seen in bilateral subclavian              arteries. *See table(s) above for measurements and observations.     Preliminary     Time Spent in minutes  30   Susa Raring M.D on 09/18/2019 at 10:47 AM  To page go to [email or web address] - password [facility]

## 2019-09-18 NOTE — Progress Notes (Signed)
   Discussed with patient in person, and her son Deniece Portela, in person.   Pt with newly depressed EF. Neither of them are certain if she has had this diagnosis previously.   The patient states is not sure if she has ever "had atrial fibrillation" before, but her son is fairly certain she has not. Pt has alzheimers dementia at baseline.   Discussed indication for monitoring and follow up. Will also apply in the setting of baseline bradycardia.   Discussed with Dr. Thedore Mins that patient will also need general cardiology evaluation for her apparently new systolic CHF.   Above discussed with Dr. Elberta Fortis.  Monitor to be arranged. Discussed with son. Pt will be living with them for the next several weeks while she recovers. They live in a close proximity to her, and will continue to check her mailbox, so would just send it to her home listed in Epic.   Casimiro Needle [address]" [place], PA-C  09/18/2019 11:19 AM

## 2019-09-18 NOTE — Evaluation (Signed)
Physical Therapy Evaluation Patient Details Name: Andrea Warner MRN: [id number] DOB: September 25, 1931 Today's Date: 09/18/2019   History of Present Illness  86 y.o female presented from home 09/16/19 with slurred speech. CT unremarkable; MRI brain with no acute changes (did note marrow signal in c-spine due to ?degenerative disease vs metastatic disease or melanoma) PMH includes Alzheimer's dementia, HTN, DM, HLD.  Clinical Impression   Pt admitted with above diagnosis. Per chart review, pt less confused today compared to OT evaluation 09/17/19. Unclear level of assist family currently provides at home. Patient may be close to her baseline at this time. (No loss of balance with PT; required cues for use of RW as she normally uses a cane). She was oriented x 3 with time not tested.  Pt currently with functional limitations due to the deficits listed below (see PT Problem List). Pt will benefit from skilled PT to increase their independence and safety with mobility to allow discharge to the venue listed below.       Follow Up Recommendations Home health PT vs SNF;Supervision/Assistance - 24 hour (?close to baseline; if family support then HHPT, if not will need SNF)    Equipment Recommendations  None recommended by PT    Recommendations for Other Services       Precautions / Restrictions Precautions Precautions: Fall Precaution Comments: bradycardic      Mobility  Bed Mobility Overal bed mobility: Needs Assistance Bed Mobility: Supine to Sit     Supine to sit: Supervision     General bed mobility comments: supervision for safety only  Transfers Overall transfer level: Needs assistance Equipment used: Rolling walker (2 wheeled) Transfers: Sit to/from Stand Sit to Stand: Min guard         General transfer comment: vc for safe use of RW from EOB, and use of grab bar from standard height toilet; no physical assist needed  Ambulation/Gait Ambulation/Gait assistance: Min guard Gait  Distance (Feet): 15 Feet (x2) Assistive device: Rolling walker (2 wheeled) Gait Pattern/deviations: Step-to pattern;Decreased stride length;Trunk flexed;Antalgic     General Gait Details: vc for safe use of RW (pt normally uses cane); slight antalgic pattern--reports rt bunion pain; no shoes present for pt to use; distance limited to bathroom only due to foot pain  Stairs            Wheelchair Mobility    Modified Rankin (Stroke Patients Only)       Balance Overall balance assessment: Needs assistance Sitting-balance support: Feet unsupported;No upper extremity supported Sitting balance-Leahy Scale: Good (able to reach to pull up her sock)     Standing balance support: During functional activity;No upper extremity supported Standing balance-Leahy Scale: Fair Standing balance comment: able to wash hands at sink, including reaching for soap and paper towel dispensers                             Pertinent Vitals/Pain Pain Assessment: Faces Faces Pain Scale: Hurts a little bit Pain Location: rt foot/bunion Pain Descriptors / Indicators: Sore Pain Intervention(s): Limited activity within patient's tolerance;Other (comment) (used RW)    Home Living Family/patient expects to be discharged to:: Private residence Living Arrangements: Other relatives Available Help at Discharge: Family Type of Home: House Home Access: Stairs to enter   Technical brewer of Steps: 2 [place]: One level Home Equipment: [place] - single point;Walker - 2 wheels Additional Comments: unsure of accuracy due to baseline dementia. States family members check in  a lot but lives with sister in law?    Prior Function Level of Independence: Independent with assistive device(s)         Comments: reports walking around with cane     Hand Dominance        Extremity/Trunk Assessment   Upper Extremity Assessment Upper Extremity Assessment: Defer to OT evaluation    Lower  Extremity Assessment Lower Extremity Assessment: Generalized weakness    Cervical / Trunk Assessment Cervical / Trunk Assessment: Kyphotic  Communication   Communication: No difficulties  Cognition Arousal/Alertness: Awake/alert Behavior During Therapy: WFL for tasks assessed/performed Overall Cognitive Status: No family/caregiver present to determine baseline cognitive functioning Area of Impairment: Following commands;Safety/judgement;Problem solving                 Orientation Level: Disoriented to (ox3; time NT)     Following Commands: Follows one step commands consistently Safety/Judgement: Decreased awareness of safety (trying to climb out of bed on her own on arrival)   Problem Solving: Requires verbal cues;Requires tactile cues General Comments: following all instructions; not familiar with use of RW and required cues for safe use (tended to "park" RW off to side and reach for furniture)      General Comments General comments (skin integrity, edema, etc.): appears much more clear than during OT evaluation 09/17/19    Exercises     Assessment/Plan    PT Assessment Patient needs continued PT services  PT Problem List Decreased strength;Decreased balance;Decreased mobility;Decreased knowledge of use of DME;Decreased safety awareness;Pain       PT Treatment Interventions DME instruction;Gait training;Functional mobility training;Therapeutic activities;Therapeutic exercise;Cognitive remediation;Patient/family education    PT Goals (Current goals can be found in the Care Plan section)  Acute Rehab PT Goals Patient Stated Goal: to walk more independently PT Goal Formulation: With patient Time For Goal Achievement: 10/02/19 Potential to Achieve Goals: Good    Frequency Min 3X/week   Barriers to discharge Decreased caregiver support unclear if has 24/7 assist    Co-evaluation               AM-PAC PT "6 Clicks" Mobility  Outcome Measure Help needed  turning from your back to your side while in a flat bed without using bedrails?: None Help needed moving from lying on your back to sitting on the side of a flat bed without using bedrails?: None Help needed moving to and from a bed to a chair (including a wheelchair)?: A Little Help needed standing up from a chair using your arms (e.g., wheelchair or bedside chair)?: A Little Help needed to walk in hospital room?: A Little Help needed climbing 3-5 steps with a railing? : A Little 6 Click Score: 20    End of Session Equipment Utilized During Treatment: Gait belt Activity Tolerance: Patient tolerated treatment well Patient left: in chair;with call bell/phone within reach;with chair alarm set Nurse Communication: Mobility status PT Visit Diagnosis: Difficulty in walking, not elsewhere classified (R26.2);Muscle weakness (generalized) (M62.81)    Time: 7124-5809 PT Time Calculation (min) (ACUTE ONLY): 25 min   Charges:   PT Evaluation $PT Eval Low Complexity: 1 Low PT Treatments $Therapeutic Activity: 8-22 mins         Jerolyn Center, PT Pager [phone]   Zena Amos 09/18/2019, 10:02 AM

## 2019-09-19 ENCOUNTER — Emergency Department (HOSPITAL_COMMUNITY): Payer: Medicare HMO

## 2019-09-19 ENCOUNTER — Observation Stay (HOSPITAL_COMMUNITY): Payer: Medicare HMO

## 2019-09-19 ENCOUNTER — Observation Stay (HOSPITAL_COMMUNITY)
Admission: EM | Admit: 2019-09-19 | Discharge: 2019-09-20 | Disposition: A | Discharge status: Home / Self Care | Payer: Medicare HMO | Attending: Internal Medicine | Admitting: Internal Medicine

## 2019-09-19 ENCOUNTER — Encounter (HOSPITAL_COMMUNITY): Payer: Self-pay | Admitting: Emergency Medicine

## 2019-09-19 ENCOUNTER — Other Ambulatory Visit: Payer: Self-pay

## 2019-09-19 DIAGNOSIS — R262 Difficulty in walking, not elsewhere classified: Secondary | ICD-10-CM | POA: Insufficient documentation

## 2019-09-19 DIAGNOSIS — R419 Unspecified symptoms and signs involving cognitive functions and awareness: Secondary | ICD-10-CM | POA: Diagnosis not present

## 2019-09-19 DIAGNOSIS — F0280 Dementia in other diseases classified elsewhere without behavioral disturbance: Secondary | ICD-10-CM | POA: Insufficient documentation

## 2019-09-19 DIAGNOSIS — G309 Alzheimer's disease, unspecified: Secondary | ICD-10-CM | POA: Insufficient documentation

## 2019-09-19 DIAGNOSIS — M6281 Muscle weakness (generalized): Secondary | ICD-10-CM | POA: Insufficient documentation

## 2019-09-19 DIAGNOSIS — R4189 Other symptoms and signs involving cognitive functions and awareness: Secondary | ICD-10-CM | POA: Diagnosis not present

## 2019-09-19 DIAGNOSIS — Z66 Do not resuscitate: Secondary | ICD-10-CM | POA: Diagnosis present

## 2019-09-19 DIAGNOSIS — I959 Hypotension, unspecified: Secondary | ICD-10-CM | POA: Insufficient documentation

## 2019-09-19 DIAGNOSIS — R9431 Abnormal electrocardiogram [ECG] [EKG]: Secondary | ICD-10-CM

## 2019-09-19 DIAGNOSIS — N390 Urinary tract infection, site not specified: Secondary | ICD-10-CM | POA: Insufficient documentation

## 2019-09-19 DIAGNOSIS — Z79899 Other long term (current) drug therapy: Secondary | ICD-10-CM | POA: Insufficient documentation

## 2019-09-19 DIAGNOSIS — E1122 Type 2 diabetes mellitus with diabetic chronic kidney disease: Secondary | ICD-10-CM | POA: Insufficient documentation

## 2019-09-19 DIAGNOSIS — E1169 Type 2 diabetes mellitus with other specified complication: Secondary | ICD-10-CM

## 2019-09-19 DIAGNOSIS — I509 Heart failure, unspecified: Secondary | ICD-10-CM | POA: Insufficient documentation

## 2019-09-19 DIAGNOSIS — R4182 Altered mental status, unspecified: Secondary | ICD-10-CM | POA: Insufficient documentation

## 2019-09-19 DIAGNOSIS — R778 Other specified abnormalities of plasma proteins: Secondary | ICD-10-CM | POA: Insufficient documentation

## 2019-09-19 DIAGNOSIS — N183 Chronic kidney disease, stage 3 unspecified: Secondary | ICD-10-CM | POA: Insufficient documentation

## 2019-09-19 DIAGNOSIS — Z20822 Contact with and (suspected) exposure to covid-19: Secondary | ICD-10-CM | POA: Insufficient documentation

## 2019-09-19 DIAGNOSIS — D72829 Elevated white blood cell count, unspecified: Secondary | ICD-10-CM | POA: Diagnosis present

## 2019-09-19 DIAGNOSIS — E039 Hypothyroidism, unspecified: Secondary | ICD-10-CM | POA: Insufficient documentation

## 2019-09-19 DIAGNOSIS — I6782 Cerebral ischemia: Secondary | ICD-10-CM | POA: Insufficient documentation

## 2019-09-19 DIAGNOSIS — I130 Hypertensive heart and chronic kidney disease with heart failure and stage 1 through stage 4 chronic kidney disease, or unspecified chronic kidney disease: Secondary | ICD-10-CM | POA: Insufficient documentation

## 2019-09-19 DIAGNOSIS — R404 Transient alteration of awareness: Secondary | ICD-10-CM | POA: Diagnosis present

## 2019-09-19 DIAGNOSIS — Z23 Encounter for immunization: Secondary | ICD-10-CM | POA: Insufficient documentation

## 2019-09-19 DIAGNOSIS — D649 Anemia, unspecified: Secondary | ICD-10-CM | POA: Insufficient documentation

## 2019-09-19 DIAGNOSIS — Z7982 Long term (current) use of aspirin: Secondary | ICD-10-CM | POA: Insufficient documentation

## 2019-09-19 DIAGNOSIS — E119 Type 2 diabetes mellitus without complications: Secondary | ICD-10-CM

## 2019-09-19 LAB — CBC WITH DIFFERENTIAL/PLATELET
Abs Immature Granulocytes: 0 10*3/uL (ref 0.00–0.07)
Basophils Absolute: 0 10*3/uL (ref 0.0–0.1)
Basophils Relative: 0 %
Eosinophils Absolute: 0.8 10*3/uL — ABNORMAL HIGH (ref 0.0–0.5)
Eosinophils Relative: 6 %
HCT: 29.1 % — ABNORMAL LOW (ref 36.0–46.0)
Hemoglobin: 9.6 g/dL — ABNORMAL LOW (ref 12.0–15.0)
Lymphocytes Relative: 14 %
Lymphs Abs: 1.9 10*3/uL (ref 0.7–4.0)
MCH: 28.2 pg (ref 26.0–34.0)
MCHC: 33 g/dL (ref 30.0–36.0)
MCV: 85.6 fL (ref 80.0–100.0)
Monocytes Absolute: 0.7 10*3/uL (ref 0.1–1.0)
Monocytes Relative: 5 %
Neutro Abs: 10.4 10*3/uL — ABNORMAL HIGH (ref 1.7–7.7)
Neutrophils Relative %: 75 %
Platelets: 232 10*3/uL (ref 150–400)
RBC: 3.4 MIL/uL — ABNORMAL LOW (ref 3.87–5.11)
RDW: 15.6 % — ABNORMAL HIGH (ref 11.5–15.5)
WBC: 13.8 10*3/uL — ABNORMAL HIGH (ref 4.0–10.5)
nRBC: 0.1 % (ref 0.0–0.2)
nRBC: 1 /100 WBC — ABNORMAL HIGH

## 2019-09-19 LAB — DIFFERENTIAL
Abs Immature Granulocytes: 0 10*3/uL (ref 0.00–0.07)
Basophils Absolute: 0.2 10*3/uL — ABNORMAL HIGH (ref 0.0–0.1)
Basophils Relative: 1 %
Eosinophils Absolute: 0.6 10*3/uL — ABNORMAL HIGH (ref 0.0–0.5)
Eosinophils Relative: 4 %
Lymphocytes Relative: 10 %
Lymphs Abs: 1.6 10*3/uL (ref 0.7–4.0)
Monocytes Absolute: 1.2 10*3/uL — ABNORMAL HIGH (ref 0.1–1.0)
Monocytes Relative: 8 %
Neutro Abs: 12 10*3/uL — ABNORMAL HIGH (ref 1.7–7.7)
Neutrophils Relative %: 77 %
nRBC: 0 /100 WBC

## 2019-09-19 LAB — GLUCOSE, CAPILLARY: Glucose-Capillary: 100 mg/dL — ABNORMAL HIGH (ref 70–99)

## 2019-09-19 LAB — COMPREHENSIVE METABOLIC PANEL
ALT: 9 U/L (ref 0–44)
ALT: 11 U/L (ref 0–44)
AST: 26 U/L (ref 15–41)
AST: 31 U/L (ref 15–41)
Albumin: 2.4 g/dL — ABNORMAL LOW (ref 3.5–5.0)
Albumin: 2.5 g/dL — ABNORMAL LOW (ref 3.5–5.0)
Alkaline Phosphatase: 57 U/L (ref 38–126)
Alkaline Phosphatase: 58 U/L (ref 38–126)
Anion gap: 8 (ref 5–15)
Anion gap: 13 (ref 5–15)
BUN: 13 mg/dL (ref 8–23)
BUN: 15 mg/dL (ref 8–23)
CO2: 21 mmol/L — ABNORMAL LOW (ref 22–32)
CO2: 16 mmol/L — ABNORMAL LOW (ref 22–32)
Calcium: 9.1 mg/dL (ref 8.9–10.3)
Calcium: 9.3 mg/dL (ref 8.9–10.3)
Chloride: 104 mmol/L (ref 98–111)
Chloride: 105 mmol/L (ref 98–111)
Creatinine, Ser: 1.1 mg/dL — ABNORMAL HIGH (ref 0.44–1.00)
Creatinine, Ser: 1.34 mg/dL — ABNORMAL HIGH (ref 0.44–1.00)
GFR calc Af Amer: 53 mL/min — ABNORMAL LOW (ref >60)
GFR calc Af Amer: 42 mL/min — ABNORMAL LOW (ref >60)
GFR calc non Af Amer: 46 mL/min — ABNORMAL LOW (ref >60)
GFR calc non Af Amer: 36 mL/min — ABNORMAL LOW (ref >60)
Glucose, Bld: 78 mg/dL (ref 70–99)
Glucose, Bld: 191 mg/dL — ABNORMAL HIGH (ref 70–99)
Potassium: 4 mmol/L (ref 3.5–5.1)
Potassium: 3.5 mmol/L (ref 3.5–5.1)
Sodium: 133 mmol/L — ABNORMAL LOW (ref 135–145)
Sodium: 134 mmol/L — ABNORMAL LOW (ref 135–145)
Total Bilirubin: 0.6 mg/dL (ref 0.3–1.2)
Total Bilirubin: 0.7 mg/dL (ref 0.3–1.2)
Total Protein: 6 g/dL — ABNORMAL LOW (ref 6.5–8.1)
Total Protein: 6.2 g/dL — ABNORMAL LOW (ref 6.5–8.1)

## 2019-09-19 LAB — I-STAT CHEM 8, ED
BUN: 14 mg/dL (ref 8–23)
Calcium, Ion: 1.19 mmol/L (ref 1.15–1.40)
Chloride: 101 mmol/L (ref 98–111)
Creatinine, Ser: 1.2 mg/dL — ABNORMAL HIGH (ref 0.44–1.00)
Glucose, Bld: 191 mg/dL — ABNORMAL HIGH (ref 70–99)
HCT: 32 % — ABNORMAL LOW (ref 36.0–46.0)
Hemoglobin: 10.9 g/dL — ABNORMAL LOW (ref 12.0–15.0)
Potassium: 3.5 mmol/L (ref 3.5–5.1)
Sodium: 136 mmol/L (ref 135–145)
TCO2: 18 mmol/L — ABNORMAL LOW (ref 22–32)

## 2019-09-19 LAB — CBC
HCT: 30.6 % — ABNORMAL LOW (ref 36.0–46.0)
Hemoglobin: 10.1 g/dL — ABNORMAL LOW (ref 12.0–15.0)
MCH: 28.5 pg (ref 26.0–34.0)
MCHC: 33 g/dL (ref 30.0–36.0)
MCV: 86.4 fL (ref 80.0–100.0)
Platelets: 205 10*3/uL (ref 150–400)
RBC: 3.54 MIL/uL — ABNORMAL LOW (ref 3.87–5.11)
RDW: 15.9 % — ABNORMAL HIGH (ref 11.5–15.5)
WBC: 15.6 10*3/uL — ABNORMAL HIGH (ref 4.0–10.5)
nRBC: 0.3 % — ABNORMAL HIGH (ref 0.0–0.2)

## 2019-09-19 LAB — URINALYSIS, ROUTINE W REFLEX MICROSCOPIC
Bilirubin Urine: NEGATIVE
Glucose, UA: NEGATIVE mg/dL
Hgb urine dipstick: NEGATIVE
Ketones, ur: 5 mg/dL — AB
Leukocytes,Ua: NEGATIVE
Nitrite: NEGATIVE
Protein, ur: NEGATIVE mg/dL
Specific Gravity, Urine: 1.013 (ref 1.005–1.030)
pH: 5 (ref 5.0–8.0)

## 2019-09-19 LAB — TROPONIN I (HIGH SENSITIVITY): Troponin I (High Sensitivity): 134 ng/L (ref <18)

## 2019-09-19 LAB — RAPID URINE DRUG SCREEN, HOSP PERFORMED
Amphetamines: NOT DETECTED
Barbiturates: NOT DETECTED
Benzodiazepines: NOT DETECTED
Cocaine: NOT DETECTED
Opiates: NOT DETECTED
Tetrahydrocannabinol: NOT DETECTED

## 2019-09-19 LAB — APTT: aPTT: 109 seconds — ABNORMAL HIGH (ref 24–36)

## 2019-09-19 LAB — BRAIN NATRIURETIC PEPTIDE: B Natriuretic Peptide: 352.3 pg/mL — ABNORMAL HIGH (ref 0.0–100.0)

## 2019-09-19 LAB — MAGNESIUM: Magnesium: 1.9 mg/dL (ref 1.7–2.4)

## 2019-09-19 LAB — T3: T3, Total: 71 ng/dL (ref 71–180)

## 2019-09-19 LAB — ETHANOL: Alcohol, Ethyl (B): <10 mg/dL (ref <10)

## 2019-09-19 LAB — PROTIME-INR
INR: 1.2 (ref 0.8–1.2)
Prothrombin Time: 14.7 seconds (ref 11.4–15.2)

## 2019-09-19 LAB — SARS CORONAVIRUS 2 BY RT PCR (HOSPITAL ORDER, PERFORMED IN ~~LOC~~ HOSPITAL LAB): SARS Coronavirus 2: NEGATIVE

## 2019-09-19 LAB — CBG MONITORING, ED: Glucose-Capillary: 194 mg/dL — ABNORMAL HIGH (ref 70–99)

## 2019-09-19 MED ORDER — ASPIRIN 81 MG PO TBEC: 81.0000 mg | DELAYED_RELEASE_TABLET | Freq: Every day | ORAL | 11 refills | Status: AC

## 2019-09-19 MED ORDER — LOVASTATIN 20 MG PO TABS: 20.0000 mg | ORAL_TABLET | Freq: Every day | ORAL | Status: DC

## 2019-09-19 MED ORDER — ENOXAPARIN SODIUM 40 MG/0.4ML ~~LOC~~ SOLN
40.0000 mg | SUBCUTANEOUS | Status: DC
  Administered 2019-09-19: 40 mg via SUBCUTANEOUS
  Filled 2019-09-19: qty 0.4

## 2019-09-19 MED ORDER — SODIUM CHLORIDE 0.9 % IV SOLN
1.0000 g | INTRAVENOUS | Status: DC
  Administered 2019-09-19: 1 g via INTRAVENOUS
  Filled 2019-09-19: qty 10

## 2019-09-19 MED ORDER — ASPIRIN EC 81 MG PO TBEC
81.0000 mg | DELAYED_RELEASE_TABLET | Freq: Every day | ORAL | Status: DC
  Administered 2019-09-20: 81 mg via ORAL
  Filled 2019-09-19: qty 1

## 2019-09-19 MED ORDER — CYANOCOBALAMIN 1000 MCG PO TABS: 1000.0000 ug | ORAL_TABLET | Freq: Every day | ORAL | 0 refills | Status: AC

## 2019-09-19 MED ORDER — PRAVASTATIN SODIUM 10 MG PO TABS: 20.0000 mg | ORAL_TABLET | Freq: Every day | ORAL | Status: DC

## 2019-09-19 MED ORDER — VITAMIN B-12 1000 MCG PO TABS
1000.0000 ug | ORAL_TABLET | Freq: Every day | ORAL | Status: DC
  Administered 2019-09-20: 1000 ug via ORAL
  Filled 2019-09-19: qty 1

## 2019-09-19 MED ORDER — SODIUM CHLORIDE 0.9% FLUSH
3.0000 mL | Freq: Two times a day (BID) | INTRAVENOUS | Status: DC
  Administered 2019-09-20: 3 mL via INTRAVENOUS

## 2019-09-19 MED ORDER — ISOSORB DINITRATE-HYDRALAZINE 20-37.5 MG PO TABS: 1.0000 | ORAL_TABLET | Freq: Three times a day (TID) | ORAL | 0 refills | Status: DC

## 2019-09-19 MED ORDER — ONDANSETRON HCL 4 MG/2ML IJ SOLN
INTRAMUSCULAR | Status: AC
  Administered 2019-09-19: 4 mg
  Filled 2019-09-19: qty 2

## 2019-09-19 MED ORDER — SODIUM CHLORIDE 0.9 % IV SOLN: Freq: Once | INTRAVENOUS | Status: AC

## 2019-09-19 MED ORDER — ACETAMINOPHEN 325 MG PO TABS
650.0000 mg | ORAL_TABLET | Freq: Four times a day (QID) | ORAL | Status: DC | PRN
  Administered 2019-09-19: 650 mg via ORAL
  Filled 2019-09-19: qty 2

## 2019-09-19 MED ORDER — POTASSIUM CHLORIDE CRYS ER 20 MEQ PO TBCR
40.0000 meq | EXTENDED_RELEASE_TABLET | ORAL | Status: AC
  Administered 2019-09-19: 40 meq via ORAL
  Filled 2019-09-19: qty 2

## 2019-09-19 MED ORDER — MAGNESIUM SULFATE IN D5W 1-5 GM/100ML-% IV SOLN
1.0000 g | Freq: Once | INTRAVENOUS | Status: AC
  Administered 2019-09-19: 1 g via INTRAVENOUS
  Filled 2019-09-19: qty 100

## 2019-09-19 MED ORDER — PNEUMOCOCCAL VAC POLYVALENT 25 MCG/0.5ML IJ INJ
0.5000 mL | INJECTION | INTRAMUSCULAR | Status: AC
  Administered 2019-09-20: 0.5 mL via INTRAMUSCULAR
  Filled 2019-09-19: qty 0.5

## 2019-09-19 NOTE — Progress Notes (Signed)
Patient was discharged home by MD order; discharged instructions  review and give to patient and her daughter in low with care notes; IV DIC; skin intact; patient will be escorted to the car by nurse tech via wheelchair.

## 2019-09-19 NOTE — Care Management Important Message (Signed)
Important Message  Patient Details  Name: Andrea Warner MRN: [id number] Date of Birth: 10-03-1931   Medicare Important Message Given:  Yes   Patient left prior to delivery of IM.  IM mailed to patient address.   [name] 09/19/2019, 2:49 PM

## 2019-09-19 NOTE — Progress Notes (Signed)
Discontinued Rocephin as no signs of UTI on UA.

## 2019-09-19 NOTE — H&P (Signed)
History and Physical    Andrea Warner NFA:[id number] DOB: 11/21/1931 DOA: 09/19/2019  Referring MD/NP/PA: Alvester Chou, MD PCP: Lester Grafton., MD  Patient coming from: Home via EMS  Chief Complaint: Unresponsiveness  I have personally briefly reviewed patient's old medical records in [facility]   HPI: Andrea Warner is a 86 y.o. female with medical history significant of hypertension, hyperlipidemia, hypothyroidism, Alzheimer's dementia, diabetes mellitus type 2, and depression who presented after having an episode of unresponsiveness at home.  She just was discharged from the hospital today for suspected TIA.  Family gives history as the patient has some issues with her memory and does not recall what happened.  She came up 10 steps into the house and complained of feeling tired. She was able to sit in a chair at the kitchen table and laid her head down.  Family noted that her leg was shaking and then she became unresponsive and was drooling out of the side of her mouth.  Patient was unresponsive for about 1 minute and then came to.  She did not fall or hit her head.  Prior to leaving the hospital the patient's diastolic blood pressures were reported to be in the 30s.  Family asked if it was safe for her to go home at that time, but they were told that her pressures have been similar throughout most of the hospital stay.  Records note that patient also had signs of a urinary tract infection, but cultures showed only 20,000 colonies with multiple species present for which recollection was suggested.  Patient currently denies having any complaints.  ED Course: Patient was seen as a code stroke.  Initially evaluated by neurology and had CT scan of the brain that did not show any acute signs of a stroke.  She was noted to be afebrile with blood pressure as low as 96/43 with improvement after given 500 mL normal saline IV fluids.  Labs significant for WBC 15.6, hemoglobin 10.9, creatinine 1.2, and  glucose 191.   Neurology had recommended obtaining MRI of the brain.  TRH called to admit.  Review of Systems  Gastrointestinal: Negative for abdominal pain and vomiting.  Genitourinary: Negative for dysuria.  Neurological: Positive for loss of consciousness.  Psychiatric/Behavioral: Positive for memory loss.    Past Medical History:  Diagnosis Date  . Alzheimer's dementia (HCC)   . Depression   . GERD (gastroesophageal reflux disease)   . Hypertension   . Thyroid disease     Past Surgical History:  Procedure Laterality Date  . ABDOMINAL HYSTERECTOMY    . FRACTURE SURGERY       reports that she has never smoked. She has never used smokeless tobacco. She reports that she does not drink alcohol. No history on file for drug use.  Allergies  Allergen Reactions  . Amlodipine Swelling and Rash  . Codeine Swelling and Rash  . Cyclobenzaprine Hives  . Iodinated Diagnostic Agents Anaphylaxis, Shortness Of Breath, Swelling and Other (See Comments)    "Swelling around the throat"  . Iodine Shortness Of Breath, Swelling and Rash  . Shellfish-Derived Products Anaphylaxis, Shortness Of Breath, Swelling and Other (See Comments)    "Swelling around the throat"  . Actonel [Risedronate Sodium] Other (See Comments)    Reaction not recalled, but "I have a lot of allergies"  . Valsartan Swelling  . Atorvastatin Rash  . Losartan Potassium-Hctz Swelling and Rash  . Metoprolol Rash  . Penicillins Rash  . Pravastatin Itching, Swelling and Rash        .  Risedronate Rash  . Singulair [Montelukast Sodium] Rash  . Sulfa Antibiotics Rash  . Zafirlukast Rash    Family History  Problem Relation Age of Onset  . Hypertension Father     Prior to Admission medications   Medication Sig Start Date End Date Taking? Authorizing Provider  aspirin EC 81 MG EC tablet Take 1 tablet (81 mg total) by mouth daily. Swallow whole. 09/20/19   Thurnell Lose, MD  isosorbide-hydrALAZINE (BIDIL) 20-37.5 MG  tablet Take 1 tablet by mouth 3 (three) times daily. 09/19/19   Thurnell Lose, MD  lovastatin (MEVACOR) 20 MG tablet Take 1 tablet by mouth daily. 09/06/19   [provider]  vitamin B-12 1000 MCG tablet Take 1 tablet (1,000 mcg total) by mouth daily. 09/21/19   Thurnell Lose, MD    Physical Exam:  Constitutional: Elderly female currently NAD, calm, comfortable Vitals:   09/19/19 1430 09/19/19 1431 09/19/19 1515 09/19/19 1548  BP: (!) 96/43  (!) 111/51   Pulse:  63    Resp: 12 19 16    Temp:    (!) 97.3 F (36.3 C)  TempSrc:    Tympanic  SpO2:  100%     Eyes: PERRL, lids and conjunctivae normal ENMT: Mucous membranes are moist. Posterior pharynx clear of any exudate or lesions.   Neck: normal, supple, no masses, no thyromegaly Respiratory: clear to auscultation bilaterally, no wheezing, no crackles. Normal respiratory effort. No accessory muscle use.  Cardiovascular: Regular rate and rhythm, no murmurs / rubs / gallops. No extremity edema. 2+ pedal pulses. No carotid bruits.  Abdomen: no tenderness, no masses palpated. No hepatosplenomegaly. Bowel sounds positive.  Musculoskeletal: no clubbing / cyanosis. No joint deformity upper and lower extremities. Good ROM, no contractures. Normal muscle tone.  Skin: no rashes, lesions, ulcers. No induration Neurologic: CN 2-12 grossly intact. Sensation intact, DTR normal. Strength 5/5 in all 4.  Psychiatric: Normal judgment and insight. Alert and oriented x 3. Normal mood.     Labs on Admission: I have personally reviewed following labs and imaging studies  CBC: Recent Labs  Lab 09/16/19 1407 09/16/19 1407 09/17/19 0233 09/18/19 0234 09/19/19 0640 09/19/19 1357 09/19/19 1402  WBC 10.5  --  9.6 11.3* 13.8* 15.6*  --   NEUTROABS 5.8  --   --  6.2 10.4* 12.0*  --   HGB 9.1*   < > 10.1* 10.0* 9.6* 10.1* 10.9*  HCT 28.1*   < > 31.2* 30.6* 29.1* 30.6* 32.0*  MCV 88.1  --  86.2 86.0 85.6 86.4  --   PLT 224  --  242 220 232  205  --    < > = values in this interval not displayed.   Basic Metabolic Panel: Recent Labs  Lab 09/16/19 1407 09/16/19 1407 09/17/19 0233 09/18/19 0234 09/19/19 0640 09/19/19 1357 09/19/19 1402  NA 133*   < > 135 132* 133* 134* 136  K 3.6   < > 3.9 3.7 4.0 3.5 3.5  CL 101   < > 102 103 104 105 101  CO2 22  --  24 20* 21* 16*  --   GLUCOSE 101*   < > 107* 88 78 191* 191*  BUN 22   < > 16 14 13 15 14   CREATININE 1.73*   < > 1.30* 1.20* 1.10* 1.34* 1.20*  CALCIUM 9.1  --  9.4 8.9 9.1 9.3  --   MG  --   --  1.6* 1.6* 1.9  --   --    < > =  values in this interval not displayed.   GFR: CrCl cannot be calculated (Unknown ideal weight.). Liver Function Tests: Recent Labs  Lab 09/16/19 1407 09/18/19 0234 09/19/19 0640 09/19/19 1357  AST 20 20 26 31   ALT 10 10 9 11   ALKPHOS 62 58 57 58  BILITOT 0.7 0.6 0.6 0.7  PROT 7.1 6.1* 6.0* 6.2*  ALBUMIN 3.0* 2.3* 2.4* 2.5*   No results for input(s): LIPASE, AMYLASE in the last 168 hours. No results for input(s): AMMONIA in the last 168 hours. Coagulation Profile: Recent Labs  Lab 09/16/19 1407 09/19/19 1357  INR 1.2 1.2   Cardiac Enzymes: No results for input(s): CKTOTAL, CKMB, CKMBINDEX, TROPONINI in the last 168 hours. BNP (last 3 results) No results for input(s): PROBNP in the last 8760 hours. HbA1C: Recent Labs    09/17/19 0233  HGBA1C 6.6*   CBG: Recent Labs  Lab 09/16/19 1316 09/19/19 1349  GLUCAP 90 194*   Lipid Profile: Recent Labs    09/17/19 0233  CHOL 151  HDL 41  LDLCALC 97  TRIG 63  CHOLHDL 3.7   Thyroid Function Tests: Recent Labs    09/17/19 1130  TSH 0.086*  FREET4 1.51*   Anemia Panel: Recent Labs    09/17/19 0233  VITAMINB12 246  FOLATE 10.8  FERRITIN 933*  TIBC 151*  IRON 62   Urine analysis:    Component Value Date/Time   COLORURINE YELLOW 09/16/2019 1541   APPEARANCEUR HAZY (A) 09/16/2019 1541   LABSPEC 1.015 09/16/2019 1541   PHURINE 6.0 09/16/2019 1541   GLUCOSEU  NEGATIVE 09/16/2019 1541   HGBUR NEGATIVE 09/16/2019 1541   BILIRUBINUR NEGATIVE 09/16/2019 1541   KETONESUR NEGATIVE 09/16/2019 1541   PROTEINUR NEGATIVE 09/16/2019 1541   UROBILINOGEN 0.2 10/16/2014 1600   NITRITE NEGATIVE 09/16/2019 1541   LEUKOCYTESUR LARGE (A) 09/16/2019 1541   Sepsis Labs: Recent Results (from the past 240 hour(s))  SARS Coronavirus 2 by RT PCR (hospital order, performed in [facility] hospital lab) Nasopharyngeal Nasopharyngeal Swab     Status: None   Collection Time: 09/16/19  3:21 PM   Specimen: Nasopharyngeal Swab  Result Value Ref Range Status   SARS Coronavirus 2 NEGATIVE NEGATIVE Final    Comment: (NOTE) SARS-CoV-2 target nucleic acids are NOT DETECTED.  The SARS-CoV-2 RNA is generally detectable in upper and lower respiratory specimens during the acute phase of infection. The lowest concentration of SARS-CoV-2 viral copies this assay can detect is 250 copies / mL. A negative result does not preclude SARS-CoV-2 infection and should not be used as the sole basis for treatment or other patient management decisions.  A negative result may occur with improper specimen collection / handling, submission of specimen other than nasopharyngeal swab, presence of viral mutation(s) within the areas targeted by this assay, and inadequate number of viral copies (<250 copies / mL). A negative result must be combined with clinical observations, patient history, and epidemiological information.  Fact Sheet for Patients:   [facility]  Fact Sheet for Healthcare Providers: 09/18/19  This test is not yet approved or  cleared by the [employer] FDA and has been authorized for detection and/or diagnosis of SARS-CoV-2 by FDA under an Emergency Use Authorization (EUA).  This EUA will remain in effect (meaning this test can be used) for the duration of the COVID-19 declaration under Section 564(b)(1) of the  Act, 21 U.S.C. section 360bbb-3(b)(1), unless the authorization is terminated or revoked sooner.  Performed at [facility], [address] [place], [place] [address]   Culture, Urine     Status: Abnormal   Collection Time: 09/16/19  9:30 PM   Specimen: Urine, Random  Result Value Ref Range Status   Specimen Description URINE, RANDOM  Final   Special Requests   Final    NONE Performed at [facility], [address] [place], [place] [address]    Culture (A)  Final    20,000 COLONIES/mL MULTIPLE SPECIES PRESENT, SUGGEST RECOLLECTION   Report Status 09/18/2019 FINAL  Final  SARS Coronavirus 2 by RT PCR (hospital order, performed in [facility] lab) Nasopharyngeal Nasopharyngeal Swab     Status: None   Collection Time: 09/19/19  1:59 PM   Specimen: Nasopharyngeal Swab  Result Value Ref Range Status   SARS Coronavirus 2 NEGATIVE NEGATIVE Final    Comment: (NOTE) SARS-CoV-2 target nucleic acids are NOT DETECTED.  The SARS-CoV-2 RNA is generally detectable in upper and lower respiratory specimens during the acute phase of infection. The lowest concentration of SARS-CoV-2 viral copies this assay can detect is 250 copies / mL. A negative result does not preclude SARS-CoV-2 infection and should not be used as the sole basis for treatment or other patient management decisions.  A negative result may occur with improper specimen collection / handling, submission of specimen other than nasopharyngeal swab, presence of viral mutation(s) within the areas targeted by this assay, and inadequate number of viral copies (<250 copies / mL). A negative result must be combined with clinical observations, patient history, and epidemiological information.  Fact Sheet for Patients:   [employer]https://www.fda.gov/media/136312/download  Fact Sheet for Healthcare Providers: https://pope.com/https://www.fda.gov/media/136313/download  This test is not yet approved or  cleared by the [place]  FDA and has been authorized for detection and/or diagnosis of SARS-CoV-2 by FDA under an Emergency Use Authorization (EUA).  This EUA will remain in effect (meaning this test can be used) for the duration of the COVID-19 declaration under Section 564(b)(1) of the Act, 21 U.S.C. section 360bbb-3(b)(1), unless the authorization is terminated or revoked sooner.  Performed at [facility], [address] [place], [place] [address]      Radiological Exams on Admission: DG Chest Portable 1 View  Result Date: 09/19/2019 CLINICAL DATA:  Sepsis EXAM: PORTABLE CHEST 1 VIEW COMPARISON:  07/13/2017 FINDINGS: Cardiac shadow is within normal limits. Aortic calcifications are seen. Ovoid area of scarring is again noted in the right upper lobe stable from prior exams dating back to 2014. No focal infiltrate or sizable effusion is seen. No bony abnormality is noted. IMPRESSION: No acute abnormality noted.  No acute abnormality seen. Electronically Signed   By: Alcide CleverMark  Lukens M.D.   On: 09/19/2019 15:38   CT HEAD CODE STROKE WO CONTRAST  Result Date: 09/19/2019 CLINICAL DATA:  Code stroke. Ataxia. Left facial droop. Rule out stroke. EXAM: CT HEAD WITHOUT CONTRAST TECHNIQUE: Contiguous axial images were obtained from the base of the skull through the vertex without intravenous contrast. COMPARISON:  CT head 09/16/2019 FINDINGS: Brain: Generalized atrophy. Negative for hydrocephalus. Patchy white matter hypodensity diffusely is unchanged most compatible with chronic microvascular ischemia. Negative for acute infarct, hemorrhage, mass. Vascular: Negative for hyperdense vessel Skull: Negative Sinuses/Orbits: Mild mucosal edema paranasal sinuses. Bilateral cataract extraction. Other: None ASPECTS (Alberta Stroke Program Early CT Score) - Ganglionic level infarction (caudate, lentiform nuclei, internal capsule, insula, M1-M3 cortex): 7 - Supraganglionic infarction (M4-M6 cortex): 3 Total score (0-10 with 10 being  normal): 10 IMPRESSION: 1. No  acute intracranial abnormality 2. ASPECTS is 10 Electronically Signed   By: Marlan Palau M.D.   On: 09/19/2019 14:14    EKG: Independently reviewed.  Sinus rhythm at 64 bpm with QT C5 72  Assessment/Plan Episode of unresponsiveness/syncope: Acute.  Patient presented after having episode of unresponsiveness with likely loss of consciousness.  CT scan of the brain showed no acute abnormalities.  Evaluated by neurology and likely appears to have had a syncopal episode after exerting herself as blood pressures were already ordered to have been low.   -Admit to a telemetry bed -Neurochecks  -Check orthostatic vital signs( after patient was already given bolus of IV fluids)  Urinary tract infection: During previous hospitalization patient appeared to have signs of a urinary tract infection, but urine culture showed only 20,000 col/mL with multiple species present to suggest recollection. -Follow-up urinalysis and urine culture -Empirically start Rocephin IV  Leukocytosis: Acute.  WBC elevated at 15.6, but appears to have been trending upward.  Suspect likely UTI as cause. -Recheck CBC in a.m.  Transient hypotension: Resolved.  Initial blood pressures noted to be as low as 96/43 with improvement with IV fluids.  Patient had been started on blood pressure medications of BiDil during her hospital stay and had been discontinued diltiazem 180 mg daily, hydrochlorothiazide 25 mg daily, and ibesartan 300 mg daily -Held BiDil due to low blood pressures   Prolonged QT interval: Acute.  QTC 572. -Avoid QT prolonging medications -Correct any electrolyte abnormalities  Normocytic anemia: Stable.  Hemoglobin 10.1 which appears near patient's baseline. -Continue to monitor  Elevated troponin: Troponin I 34 which appears to be appears to be trending down from 264 on 6/20.  Recent TIA: Patient had been discharged from the hospital and recommended to take aspirin donepezil  daily and in addition to statin. -Continue aspirin and statin  Alzheimer's dementia: Patient noted to have mild dementia.  Donepezil stopped during hospitalization  Diabetes mellitus type 2: Patient had been diet controlled.  Hemoglobin A1c on 6/20 was 6.6. -Continue heart healthy and carb modified diet.  Hypothyroidism: On 6/20 patient was found to have TSH of 0.086 with free T4 1.51.  Patient had been on 75 mcg of Synthroid which were discontinued. -Recommended PCP to monitor TSH and then resume lower dose Synthroid when medically appropriate  Chronic kidney disease stage III: Stable. -Continue to monitor  Hyperlipidemia: Patient was discharged home on lovastatin 20 mg daily. -Continue pharmacy substitution of pravastatin  DO NOT RESUSCITATE present on admission  DVT prophylaxis: Lovenox Code Status: DNR Family Communication: Son updated over the phone Disposition Plan: Possible discharge home in 1 to 2 days Consults called: Neurology Admission status: Observation  Clydie Braun MD [facility] Pager [phone]   If 7PM-7AM, please contact night-coverage [email or web address] Password [facility]  09/19/2019, 4:44 PM

## 2019-09-19 NOTE — Discharge Instructions (Signed)
Follow with Primary MD  in 7 days   Get CBC, CMP, TSH, 2 view Chest X ray -  checked next visit within 1 week by Primary MD   Activity: As tolerated with Full fall precautions use walker/cane & assistance as needed  Disposition Home   Diet: Heart Healthy  Low Carb  Special Instructions: If you have smoked or chewed Tobacco  in the last 2 yrs please stop smoking, stop any regular Alcohol  and or any Recreational drug use.  On your next visit with your primary care physician please Get Medicines reviewed and adjusted.  Please request your Prim.MD to go over all Hospital Tests and Procedure/Radiological results at the follow up, please get all Hospital records sent to your Prim MD by signing hospital release before you go home.  If you experience worsening of your admission symptoms, develop shortness of breath, life threatening emergency, suicidal or homicidal thoughts you must seek medical attention immediately by calling 911 or calling your MD immediately  if symptoms less severe.  You Must read complete instructions/literature along with all the possible adverse reactions/side effects for all the Medicines you take and that have been prescribed to you. Take any new Medicines after you have completely understood and accpet all the possible adverse reactions/side effects.

## 2019-09-19 NOTE — ED Notes (Signed)
Sent a urine culture with the urine specimen 

## 2019-09-19 NOTE — ED Notes (Signed)
Dinner Tray Ordered @ [phone].

## 2019-09-19 NOTE — Code Documentation (Signed)
86 yo female coming from home after being discharged this morning from the hospital. Family reports she made it home and then walked ten steps up the stairs. When she made it to the top, she sat down in a chair and reported that she didn't feel well. Family reports three minutes of unresponsive stare along with drooling. EMS was called and activated a Code Stroke.   EMS reports that she was alert with them, but drowsy. BP 80s/40s during route. CBG 256. Right facial droop and slurred speech noted. Denied any arm or leg weakness. Upon arrival, right facial droop noted with no other deficits. EDP Bero cleared airway and labs drawn. Pt taken to CT.   After moving to the CT scanner, pt was noted to have a sudden onset of vomiting. 4 mg of Zofran given at 1400. CT Head negative for stroke. Reassessment showed no deficits for patient. BP 130/55 in CT scanner. Plan for patient to be TIA Alert. Q2 hour mNIHSS/VS to be completed. Handoff given to Tobi Bastos, Charity fundraiser.

## 2019-09-19 NOTE — Code Documentation (Signed)
Reassessed patient at 1430 and noted that patient continues to be drowsy and has slight slurred speech. MD Aroor and MD Trifan made aware. BP 96/43 at the same time. Symptoms believed to be related to BP. 500 cc bolus ordered and started.

## 2019-09-19 NOTE — ED Provider Notes (Signed)
[facility] EMERGENCY DEPARTMENT Provider Note   CSN: [id number] Arrival date & time: 09/19/19  1346  An emergency department physician performed an initial assessment on this suspected stroke patient at 1347.  History No chief complaint on file.   Andrea Warner is a 86 y.o. female w/ alzheimer's dementia, discharge from [facility] this morning for TIA evaluation, HTN, hypothyroidism, presenting by EMS with AMS.  The patient reportedly just arrived home from the hospital and went unresponsive in front her of son.  EMS reports patient was awake but groggy on their arrival.  No report of seizure type activity.  En route to hospital EMS felt there was a right sided facial droop and patient became unresponsive again, and so a CODE STROKE was activated.  BP reported 80/40 en route.  CBG wnl.     Her son Andrea Warner and daughter-in-law state upon arrival home today, patient walked into house (climbed up about 10 steps from the garage), and felt very tired, and was helped to kitchen table.  She said "I'm tired," and then began having a "blank stare" after laying her head down on the table.  After that "her eyes rolled back in her head and she started drooling from the mouth."  She was unresponsive verbally at all.  After 3 minutes she "came around" and could answer yes/no.  On her arrival patient is lethargic, but can answer simple questions.  She tells me she has no headache.  She says she feels "fine."  She denies CP or SOB.  She feels "woozy."  Hx of CHF with EF 35%  On arrival in the CT scanner patient had 1 episode of nonbloody emesis.  HPI     Past Medical History:  Diagnosis Date  . Alzheimer's dementia ([place])   . Depression   . GERD (gastroesophageal reflux disease)   . Hypertension   . Thyroid disease     Patient Active Problem List   Diagnosis Date Noted  . Episode of unresponsiveness 09/19/2019  . Acute lower UTI 09/19/2019  . Leukocytosis 09/19/2019  .  Transient hypotension 09/19/2019  . Elevated troponin 09/19/2019  . Prolonged QT interval 09/19/2019  . DNR (do not resuscitate) 09/19/2019  . Cerebral embolism with cerebral infarction 09/18/2019  . Sinus bradycardia 09/18/2019  . Acute systolic heart failure ([place]) 09/18/2019  . Slurred speech 09/16/2019  . Expressive aphasia 09/16/2019  . Alzheimer's dementia ([place])   . Type 2 diabetes mellitus ([place])   . Normocytic anemia   . AKI (acute kidney injury) ([place])   . Hypertension associated with diabetes ([place])   . Hyperlipidemia associated with type 2 diabetes mellitus ([place])   . Hypothyroidism     Past Surgical History:  Procedure Laterality Date  . ABDOMINAL HYSTERECTOMY    . FRACTURE SURGERY       OB History   No obstetric history on file.     Family History  Problem Relation Age of Onset  . Hypertension Father     Social History   Tobacco Use  . Smoking status: Never Smoker  . Smokeless tobacco: Never Used  Substance Use Topics  . Alcohol use: No  . Drug use: Not on file    Home Medications Prior to Admission medications   Medication Sig Start Date End Date Taking? Authorizing Provider  aspirin EC 81 MG EC tablet Take 1 tablet (81 mg total) by mouth daily. Swallow whole. 09/20/19   Thurnell Lose, MD  isosorbide-hydrALAZINE (BIDIL) 20-37.5 MG  tablet Take 1 tablet by mouth 3 (three) times daily. 09/19/19   Leroy Sea, MD  lovastatin (MEVACOR) 20 MG tablet Take 20 mg by mouth daily.  09/06/19   [provider]  vitamin B-12 1000 MCG tablet Take 1 tablet (1,000 mcg total) by mouth daily. 09/21/19   Leroy Sea, MD    Allergies    Amlodipine, Codeine, Cyclobenzaprine, Iodinated diagnostic agents, Iodine, Shellfish-derived products, Actonel [risedronate sodium], Valsartan, Atorvastatin, Losartan potassium-hctz, Metoprolol, Penicillins, Pravastatin, Risedronate, Singulair [montelukast sodium], Sulfa antibiotics, and Zafirlukast  Review of Systems     Review of Systems  Unable to perform ROS: Dementia (level 5 caveat)    Physical Exam Updated Vital Signs BP (!) 130/46 (BP Location: Left Arm)   Pulse 62   Temp 98.5 F (36.9 C) (Oral)   Resp 18   Ht 5\' 4"  (1.626 m)   Wt 53.4 kg   SpO2 100%   BMI 20.21 kg/m   Physical Exam Vitals and nursing note reviewed.  Constitutional:      Appearance: She is well-developed.     Comments: Initial presentation lethargy, mumbled and confused speech Gradual improvement of alertness Thin, cachectic appearing female  HENT:     Head: Normocephalic and atraumatic.  Eyes:     Conjunctiva/sclera: Conjunctivae normal.  Cardiovascular:     Rate and Rhythm: Normal rate and regular rhythm.     Pulses: Normal pulses.  Pulmonary:     Effort: Pulmonary effort is normal. No respiratory distress.  Abdominal:     Palpations: Abdomen is soft.     Tenderness: There is no abdominal tenderness.  Musculoskeletal:     Cervical back: Neck supple.  Skin:    General: Skin is warm and dry.     ED Results / Procedures / Treatments   Labs (all labs ordered are listed, but only abnormal results are displayed) Labs Reviewed  APTT - Abnormal; Notable for the following components:      Result Value   aPTT 109 (*)    All other components within normal limits  CBC - Abnormal; Notable for the following components:   WBC 15.6 (*)    RBC 3.54 (*)    Hemoglobin 10.1 (*)    HCT 30.6 (*)    RDW 15.9 (*)    nRBC 0.3 (*)    All other components within normal limits  DIFFERENTIAL - Abnormal; Notable for the following components:   Neutro Abs 12.0 (*)    Monocytes Absolute 1.2 (*)    Eosinophils Absolute 0.6 (*)    Basophils Absolute 0.2 (*)    All other components within normal limits  COMPREHENSIVE METABOLIC PANEL - Abnormal; Notable for the following components:   Sodium 134 (*)    CO2 16 (*)    Glucose, Bld 191 (*)    Creatinine, Ser 1.34 (*)    Total Protein 6.2 (*)    Albumin 2.5 (*)    GFR calc  non Af Amer 36 (*)    GFR calc Af Amer 42 (*)    All other components within normal limits  URINALYSIS, ROUTINE W REFLEX MICROSCOPIC - Abnormal; Notable for the following components:   Ketones, ur 5 (*)    All other components within normal limits  CBC - Abnormal; Notable for the following components:   WBC 15.7 (*)    RBC 3.62 (*)    Hemoglobin 10.3 (*)    HCT 31.2 (*)    RDW 15.9 (*)  All other components within normal limits  BASIC METABOLIC PANEL - Abnormal; Notable for the following components:   CO2 21 (*)    Creatinine, Ser 1.19 (*)    GFR calc non Af Amer 42 (*)    GFR calc Af Amer 48 (*)    All other components within normal limits  GLUCOSE, CAPILLARY - Abnormal; Notable for the following components:   Glucose-Capillary 100 (*)    All other components within normal limits  I-STAT CHEM 8, ED - Abnormal; Notable for the following components:   Creatinine, Ser 1.20 (*)    Glucose, Bld 191 (*)    TCO2 18 (*)    Hemoglobin 10.9 (*)    HCT 32.0 (*)    All other components within normal limits  CBG MONITORING, ED - Abnormal; Notable for the following components:   Glucose-Capillary 194 (*)    All other components within normal limits  TROPONIN I (HIGH SENSITIVITY) - Abnormal; Notable for the following components:   Troponin I (High Sensitivity) 134 (*)    All other components within normal limits  SARS CORONAVIRUS 2 BY RT PCR (HOSPITAL ORDER, PERFORMED IN [facility])  ETHANOL  PROTIME-INR  RAPID URINE DRUG SCREEN, HOSP PERFORMED  GLUCOSE, CAPILLARY    EKG EKG Interpretation  Date/Time:  Tuesday September 19 2019 14:18:57 EDT Ventricular Rate:  64 PR Interval:    QRS Duration: 107 QT Interval:  554 QTC Calculation: 572 R Axis:   82 Text Interpretation: Sinus rhythm Borderline right axis deviation Low voltage, extremity leads Prolonged QT interval No STEMI Confirmed by Alvester Chou [phone]) on 09/19/2019 3:47:12 PM   Radiology MR ANGIO HEAD WO  CONTRAST  Result Date: 09/19/2019 CLINICAL DATA:  Recurrent syncope EXAM: MRI HEAD WITHOUT CONTRAST MRA HEAD WITHOUT CONTRAST TECHNIQUE: Multiplanar, multiecho pulse sequences of the brain and surrounding structures were obtained without intravenous contrast. Angiographic images of the head were obtained using MRA technique without contrast. COMPARISON:  None. FINDINGS: MRI HEAD FINDINGS BRAIN: No acute infarct, acute hemorrhage or extra-axial collection. Early confluent hyperintense T2-weighted signal of the periventricular and deep white matter, most commonly due to chronic ischemic microangiopathy. There is generalized atrophy without lobar predilection. No chronic microhemorrhage. Normal midline structures. VASCULAR: Major flow voids are preserved. SKULL AND UPPER CERVICAL SPINE: Normal calvarium and skull base. Visualized upper cervical spine and soft tissues are normal. SINUSES/ORBITS: No paranasal sinus fluid levels or advanced mucosal thickening. No mastoid or middle ear effusion. Normal orbits. MRA HEAD FINDINGS POSTERIOR CIRCULATION: --Vertebral arteries: Normal V4 segments. --Inferior cerebellar arteries: Normal. --Basilar artery: Normal. --Superior cerebellar arteries: Normal. --Posterior cerebral arteries: Normal. Both are predominantly supplied by the posterior communicating arteries (p-comm). ANTERIOR CIRCULATION: --Intracranial internal carotid arteries: Normal. --Anterior cerebral arteries (ACA): Normal. Both A1 segments are present. Patent anterior communicating artery (a-comm). --Middle cerebral arteries (MCA): Normal. IMPRESSION: 1. No acute intracranial abnormality. 2. Generalized atrophy and chronic ischemic microangiopathy. 3. Normal intracranial MRA. Electronically Signed   By: Deatra Robinson M.D.   On: 09/19/2019 21:43   MR BRAIN WO CONTRAST  Result Date: 09/19/2019 CLINICAL DATA:  Recurrent syncope EXAM: MRI HEAD WITHOUT CONTRAST MRA HEAD WITHOUT CONTRAST TECHNIQUE: Multiplanar,  multiecho pulse sequences of the brain and surrounding structures were obtained without intravenous contrast. Angiographic images of the head were obtained using MRA technique without contrast. COMPARISON:  None. FINDINGS: MRI HEAD FINDINGS BRAIN: No acute infarct, acute hemorrhage or extra-axial collection. Early confluent hyperintense T2-weighted signal of the periventricular and deep white  matter, most commonly due to chronic ischemic microangiopathy. There is generalized atrophy without lobar predilection. No chronic microhemorrhage. Normal midline structures. VASCULAR: Major flow voids are preserved. SKULL AND UPPER CERVICAL SPINE: Normal calvarium and skull base. Visualized upper cervical spine and soft tissues are normal. SINUSES/ORBITS: No paranasal sinus fluid levels or advanced mucosal thickening. No mastoid or middle ear effusion. Normal orbits. MRA HEAD FINDINGS POSTERIOR CIRCULATION: --Vertebral arteries: Normal V4 segments. --Inferior cerebellar arteries: Normal. --Basilar artery: Normal. --Superior cerebellar arteries: Normal. --Posterior cerebral arteries: Normal. Both are predominantly supplied by the posterior communicating arteries (p-comm). ANTERIOR CIRCULATION: --Intracranial internal carotid arteries: Normal. --Anterior cerebral arteries (ACA): Normal. Both A1 segments are present. Patent anterior communicating artery (a-comm). --Middle cerebral arteries (MCA): Normal. IMPRESSION: 1. No acute intracranial abnormality. 2. Generalized atrophy and chronic ischemic microangiopathy. 3. Normal intracranial MRA. Electronically Signed   By: Deatra Robinson M.D.   On: 09/19/2019 21:43   DG Chest Portable 1 View  Result Date: 09/19/2019 CLINICAL DATA:  Sepsis EXAM: PORTABLE CHEST 1 VIEW COMPARISON:  07/13/2017 FINDINGS: Cardiac shadow is within normal limits. Aortic calcifications are seen. Ovoid area of scarring is again noted in the right upper lobe stable from prior exams dating back to 2014. No  focal infiltrate or sizable effusion is seen. No bony abnormality is noted. IMPRESSION: No acute abnormality noted.  No acute abnormality seen. Electronically Signed   By: Alcide Clever M.D.   On: 09/19/2019 15:38   CT HEAD CODE STROKE WO CONTRAST  Result Date: 09/19/2019 CLINICAL DATA:  Code stroke. Ataxia. Left facial droop. Rule out stroke. EXAM: CT HEAD WITHOUT CONTRAST TECHNIQUE: Contiguous axial images were obtained from the base of the skull through the vertex without intravenous contrast. COMPARISON:  CT head 09/16/2019 FINDINGS: Brain: Generalized atrophy. Negative for hydrocephalus. Patchy white matter hypodensity diffusely is unchanged most compatible with chronic microvascular ischemia. Negative for acute infarct, hemorrhage, mass. Vascular: Negative for hyperdense vessel Skull: Negative Sinuses/Orbits: Mild mucosal edema paranasal sinuses. Bilateral cataract extraction. Other: None ASPECTS ([place] Stroke Program Early CT Score) - Ganglionic level infarction (caudate, lentiform nuclei, internal capsule, insula, M1-M3 cortex): 7 - Supraganglionic infarction (M4-M6 cortex): 3 Total score (0-10 with 10 being normal): 10 IMPRESSION: 1. No acute intracranial abnormality 2. ASPECTS is 10 Electronically Signed   By: Marlan Palau M.D.   On: 09/19/2019 14:14    Procedures Procedures (including critical care time)  Medications Ordered in ED Medications  aspirin EC tablet 81 mg (has no administration in time range)  vitamin B-12 (CYANOCOBALAMIN) tablet 1,000 mcg (has no administration in time range)  enoxaparin (LOVENOX) injection 40 mg (40 mg Subcutaneous Given 09/19/19 2147)  sodium chloride flush (NS) 0.9 % injection 3 mL (3 mLs Intravenous Not Given 09/19/19 2157)  lovastatin (MEVACOR) tablet 20 mg (has no administration in time range)  acetaminophen (TYLENOL) tablet 650 mg (650 mg Oral Given 09/19/19 2343)  pneumococcal 23 valent vaccine (PNEUMOVAX-23) injection 0.5 mL (has no administration  in time range)  ondansetron (ZOFRAN) 4 MG/2ML injection (4 mg  Given 09/19/19 1400)  0.9 %  sodium chloride infusion ( Intravenous Stopped 09/19/19 1822)  potassium chloride SA (KLOR-CON) CR tablet 40 mEq (40 mEq Oral Given 09/19/19 2147)  magnesium sulfate IVPB 1 g 100 mL (0 g Intravenous Stopped 09/19/19 2345)    ED Course  I have reviewed the triage vital signs and the nursing notes.  Pertinent labs & imaging results that were available during my care of the patient were reviewed by  me and considered in my medical decision making (see chart for details).  86 yo female w/ hospital discharge earlier today after TIA evaluation, new dx of CHF, presenting back to ED with staring episode and unresponsiveness at home.  Patient presented lethargic on arrival and hypotensive with BP in the 80's systolic, otherwise with stable HR and pulse ox.  She could answer focused questions.  Her mental status gradually improved during her ED course, after also receiving some gentle IV fluid resuscitation.  She presented as a CODE STROKE as there were concerns about facial droop by EMS and gaze deviance.  She did not have these symptoms on arrival.  Nonetheless she was evaluated by Dr Aroor of neurology and had a stat CT head which was unremarkable for acute infarct.  Dr Aroor felt that her transient confusion at home was likely related to a non-neurological condition, and recommended further medical investigation and workup for the hypotension, as well as MRI brain and MRI.  The patient's labs were personally reviewed by myself and were notable for gradual elevation in WBC (15.7 today, up from 13 on discharge), hgb stable at 10.3 (less likely acute bleed or anemia), largely unremarkable BMP, UA without sign of infection, UDS negative, and elevated troponin at 134.  ECG with sinus bradycardia, no STEMI.  Troponin today was downtrending from prior hospital course (264), which likely an initial elevation 2/2 demand  ischemia from CHF.  She had no active chest pain or SOB upon my reassessments today, lowering my suspicion for ACS at this time.  Additional history obtained from son and daughter in law over the phone and at the bedside.  The patient's medical chart was personally reviewed regarding recent hospitalization course.  Her ECG here was nonischemic per my interpretation and her telemetry showed sinus rhythm  Clinical Course as of Sep 20 923  Tue Sep 19, 2019  1428 IMPRESSION: 1. No acute intracranial abnormality 2. ASPECTS is 10   [MT]  1539 Patient feeling much better, awake and lucid now, son is at bedside.  She states she does not remember what happened earlier today.  She has no active complaints.  Her BP remains soft, 90's systolic.  She is receiving some fluids now.  Plan for admission to hospital   [MT]  1541 IMPRESSION: No acute abnormality noted. No acute abnormality seen.   [MT]  1541 Seen by Dr Aroor from neurology who did not feel this was consistent with a stroke or seizure initially - although consult note is still pending   [MT]  1542 NEUT#(!): 12.0 [MT]  1542 WBC(!): 15.6 [MT]  1546 Signed out to Dr Katrinka BlazingSmith hospitalist, unclear cause of episode today, pending UA   [MT]    Clinical Course User Index [MT] Brayden Brodhead, Kermit BaloMatthew J, MD    Final Clinical Impression(s) / ED Diagnoses Final diagnoses:  Unresponsiveness    Rx / DC Orders ED Discharge Orders    None       Terald Sleeperrifan, Janissa Bertram J, MD 09/20/19 [phone]

## 2019-09-19 NOTE — TOC Transition Note (Signed)
Transition of Care [facility]) - CM/SW Discharge Note   Patient Details  Name: Andrea Warner MRN: [id number] Date of Birth: April 08, 1931  [facility] CM/SW Contact:  Bess Kinds, RN Phone Number: [phone] 09/19/2019, 10:03 AM   Clinical Narrative:     Spoke with patient's son, Aveena Bari [phone]), about plans to transition home.   Patient's PCP verified as Dr. Josiah Lobo.   Preferred pharmacy is [facility], [address], [place].   Patient will discharge to her son and daughter in law's home for at least pending progress - [address] [place], [place] [address].   Patient is not currently active with a home health agency. She had received HH services about 2 years ago. Discussed agency preference. Referral accepted by [facility] with start of care for Friday 09/22/2019. HH orders and Face to Face in place for RN, PT, OT, ST, Aide, SW. Son, [place], notified of accepting agency and start of care date.   Confirmed that patient has both a walker and a cane at home.   Patient's daughter in law to provide transportation home.   No further TOC needs identified at this time.   Final next level of care: Home w Home Health Services Barriers to Discharge: No Barriers Identified   Patient Goals and CMS Choice Patient states their goals for this hospitalization and ongoing recovery are:: return home with son and daughter in law CMS Medicare.gov Compare Post Acute Care list provided to:: Patient Represenative (must comment) Hermine Messick, son) Choice offered to / list presented to : Adult Children  Discharge Placement                  Name of family member notified: Nataliee Shurtz Patient and family notified of of transfer: 09/19/19  Discharge Plan and Services In-house Referral: Clinical Social Work   Post Acute Care Choice: Home Health          DME Arranged: N/A DME Agency: NA       HH Arranged: RN, PT, OT, Nurse's Aide, Social Work, Facilities manager Therapy           Social Determinants of Health (SDOH) Interventions     Readmission Risk Interventions No flowsheet data found.

## 2019-09-19 NOTE — Consult Note (Signed)
Requesting Physician: Dr. Renaye Rakers    Chief Complaint: Slurred speech, episode of unresponsiveness  History obtained from: Patient and Chart    HPI:                                                                                                                                       Andrea Warner is a 86 y.o. female with past medical history significant for Alzheimer's disease, depression, hypertension presents the emergency department after just being released from the hospital after presenting with TIA/stroke symptoms.  Patient admitted from 6/19-6/22 for sudden onset facial droop and slurred speech and was worked up for transient ischemic attack.  Work-up revealed low EF of 35 to 40%. Patient was discharged to home and was climbing up the stairs, it appears patient passed out, had up rolling of eyes and was unresponsive for close to 3 minutes.  EMS was called and on their initial assessment patient was drowsy, slurred speech and right facial droop.  Blood pressure was low in the 80s systolic.  Patient became more alert in route and arrival to [facility], ED symptoms began to improve.  Blood pressure on arrival was 130 systolic.  Stat CT head was negative for acute findings.      Past Medical History:  Diagnosis Date  . Alzheimer's dementia (HCC)   . Depression   . GERD (gastroesophageal reflux disease)   . Hypertension   . Thyroid disease     Past Surgical History:  Procedure Laterality Date  . ABDOMINAL HYSTERECTOMY    . FRACTURE SURGERY      Family History  Problem Relation Age of Onset  . Hypertension Father    Social History:  reports that she has never smoked. She has never used smokeless tobacco. She reports that she does not drink alcohol. No history on file for drug use.  Allergies:  Allergies  Allergen Reactions  . Iodine Shortness Of Breath and Rash  . Actonel [Risedronate Sodium]   . Amlodipine   . Codeine   . Flexeril [Cyclobenzaprine]   . Lipitor  [Atorvastatin]   . Losartan Potassium-Hctz   . Penicillins   . Singulair [Montelukast Sodium]   . Sulfa Antibiotics   . Zafirlukast     Medications:  I reviewed home medications   ROS:                                                                                                                                     14 systems reviewed and negative except above   Examination:                                                                                                      General: Appears well-developed and well-nourished.  Psych: Affect appropriate to situation Eyes: No scleral injection HENT: No OP obstrucion Head: Normocephalic.  Cardiovascular: Normal rate and regular rhythm.  Respiratory: Effort normal and breath sounds normal to anterior ascultation GI: Soft.  No distension. There is no tenderness.  Skin: WDI    Neurological Examination Mental Status: Alert, orientedx3, thought content appropriate.  Speech fluent without evidence of aphasia. Able to follow 3 step commands without difficulty. Cranial Nerves: II: Visual fields grossly normal,  III,IV, VI: ptosis not present, extra-ocular motions intact bilaterally, pupils equal, round, reactive to light and accommodation V,VII: smile symmetric, facial light touch sensation normal bilaterally VIII: hearing normal bilaterally IX,X: uvula rises symmetrically XI: bilateral shoulder shrug XII: midline tongue extension Motor: Right : Upper extremity   5/5    Left:     Upper extremity   5/5  Lower extremity   5/5     Lower extremity   5/5 Tone and bulk:normal tone throughout; no atrophy noted Sensory: Pinprick and light touch intact throughout, bilaterally Plantars: Right: downgoing   Left: downgoing Cerebellar: normal finger-to-nose      Lab Results: Basic Metabolic Panel: Recent Labs  Lab  09/16/19 1407 09/16/19 1407 09/17/19 0233 09/18/19 0234 09/19/19 0640 09/19/19 1402  NA 133*  --  135 132* 133* 136  K 3.6  --  3.9 3.7 4.0 3.5  CL 101  --  102 103 104 101  CO2 22  --  24 20* 21*  --   GLUCOSE 101*  --  107* 88 78 191*  BUN 22  --  16 14 13 14   CREATININE 1.73*  --  1.30* 1.20* 1.10* 1.20*  CALCIUM 9.1   < > 9.4 8.9 9.1  --   MG  --   --  1.6* 1.6* 1.9  --    < > = values in this interval not displayed.    CBC: Recent Labs  Lab 09/16/19 1407 09/16/19 1407 09/17/19 0233 09/18/19 0234 09/19/19 0640 09/19/19 1357 09/19/19 1402  WBC 10.5  --  9.6 11.3* 13.8* 15.6*  --   NEUTROABS 5.8  --   --  6.2 10.4*  --   --   HGB 9.1*   < > 10.1* 10.0* 9.6* 10.1* 10.9*  HCT 28.1*   < > 31.2* 30.6* 29.1* 30.6* 32.0*  MCV 88.1  --  86.2 86.0 85.6 86.4  --   PLT 224  --  242 220 232 205  --    < > = values in this interval not displayed.    Coagulation Studies: Recent Labs    09/19/19 1357  LABPROT 14.7  INR 1.2    Imaging: CT HEAD CODE STROKE WO CONTRAST  Result Date: 09/19/2019 CLINICAL DATA:  Code stroke. Ataxia. Left facial droop. Rule out stroke. EXAM: CT HEAD WITHOUT CONTRAST TECHNIQUE: Contiguous axial images were obtained from the base of the skull through the vertex without intravenous contrast. COMPARISON:  CT head 09/16/2019 FINDINGS: Brain: Generalized atrophy. Negative for hydrocephalus. Patchy white matter hypodensity diffusely is unchanged most compatible with chronic microvascular ischemia. Negative for acute infarct, hemorrhage, mass. Vascular: Negative for hyperdense vessel Skull: Negative Sinuses/Orbits: Mild mucosal edema paranasal sinuses. Bilateral cataract extraction. Other: None ASPECTS (Sharon Stroke Program Early CT Score) - Ganglionic level infarction (caudate, lentiform nuclei, internal capsule, insula, M1-M3 cortex): 7 - Supraganglionic infarction (M4-M6 cortex): 3 Total score (0-10 with 10 being normal): 10 IMPRESSION: 1. No acute  intracranial abnormality 2. ASPECTS is 10 Electronically Signed   By: Franchot Gallo M.D.   On: 09/19/2019 14:14     I have reviewed the above imaging : CT head showed no acute findings   ASSESSMENT AND PLAN  86 y.o. female with past medical history significant for Alzheimer's disease, depression, hypertension presents the emergency department after just being released from the hospital after presenting with TIA/stroke symptoms presents to the ED after having episode of unresponsiveness while climbing the stairs.  Patient noted to be hypotensive by EMS.  EMS told she had right facial droop and slurred speech therefore code stroke initiated.   Syncope/presyncope, less likely TIA or stroke  Acute metabolic encephalopathy  Recommendations -MRI brain to rule out acute stroke and MRA head to assess for intracranial vasculature to see if patient is at increased risk of syncope in the setting of hypertension, carotid Dopplers performed during last admission negative for significant stenosis -Infectious and metabolic work-up to evaluate for hypotension - IV fluids -treat hypotension    Ayriana Wix [facility] Pager Number [phone]

## 2019-09-19 NOTE — ED Notes (Signed)
Attempted report x1. 

## 2019-09-19 NOTE — Discharge Summary (Signed)
Andrea Warner WUJ:[id number] DOB: [birth year] DOA: 09/16/2019  PCP: Lester Newcastle., MD  Admit date: 09/16/2019  Discharge date: 09/19/2019  Admitted From: Home  Disposition:  Home   Recommendations for Outpatient Follow-up:   Follow up with PCP in 1-2 weeks  PCP Please obtain BMP/CBC, 2 view CXR in 1week,  (see Discharge instructions)   PCP Please follow up on the following pending results: TSH, blood pressure, CBC and BMP.  Needs outpatient cardiology and neurology follow-up.   Home Health: PT, RN Equipment/Devices: None Consultations: Allergy Discharge Condition: Stable    CODE STATUS: Full    Diet Recommendation: Heart Healthy     Chief Complaint  Patient presents with  . Slurred speech     Brief history of present illness from the day of admission and additional interim summary    Andrea Smithis a 86 y.o.femalewith medical history significant forAlzheimer dementia, type 2 diabetes, hypertension, hyperlipidemia, hypothyroidism, and depression who presents to the ED for evaluation of slurred speech.                                                                 Hospital Course   1.  TIA .  Left-sided arm weakness has resolved and speech is close to normal now, MRI and CT nonacute, complete stroke work-up was done and stable.  She has been placed on aspirin statin, statin dose will not be increased due to her age although LDL is slightly above goal, A1c was stable.  Case discussed with neurologist Dr. Pearlean Brownie, plan is outpatient cardiology work-up for depressed EF and then to evaluate her for loop recorder versus Holter monitor as her presentation was suspicious for an embolic phenomenon.  She will also follow-up with neurology outpatient.  Currently she is symptom-free and close to her baseline.   2.  Mild  early Alzheimer's dementia.  Still quite functional and drives, at risk for delirium, family informed, minimize benzos and narcotics.  Aricept held due to severe bradycardia which was noted on admission, bradycardia has improved outpatient neurology follow-up.  3.  Essential hypertension.    Pressure stable currently on BiDil.  4.  Dyslipidemia.  LDL stable on present dose statin.  5.  Hypothyroidism.  Was on Synthroid which has been discontinued, PCP to monitor TSH and then resume lower Synthroid dose, she came in with 75 mcg a day.  6.  Bradycardia at rest.  Much improved after diltiazem and Aricept were discontinued.  7.  Dehydration, AKI.    Post hydration with IV fluids blood pressure is improved.  8.  Hypomagnesemia.    Placed and stable.  9.  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  Currently compensated, on aspirin, will add low-dose BiDil if blood pressure can tolerate, renal function is poor  and precludes the use of ACE/ARB or Entresto. No  Beta-blocker due to moderate to severe bradycardia, outpatient cardiology follow-up.  If creatinine remained stable low-dose ACE inhibitor can be started at a later date.  10. DM type II.  Diet controlled, A1c acceptable for her age.    PCP to monitor.   Discharge diagnosis     Principal Problem:   Slurred speech Active Problems:   Alzheimer's dementia (HCC)   Type 2 diabetes mellitus (HCC)   Normocytic anemia   AKI (acute kidney injury) (HCC)   Hypertension associated with diabetes (HCC)   Hyperlipidemia associated with type 2 diabetes mellitus (HCC)   Hypothyroidism   Expressive aphasia   Cerebral embolism with cerebral infarction    Discharge instructions    Discharge Instructions    Diet - low sodium heart healthy   Complete by: As directed    Discharge instructions   Complete by: As directed    Follow with Primary MD  in 7 days   Get CBC, TSH, CMP, 2 view Chest X ray -   checked next visit within 1 week by Primary MD   Activity: As tolerated with Full fall precautions use walker/cane & assistance as needed  Disposition Home   Diet: Heart Healthy  Low Carb  Special Instructions: If you have smoked or chewed Tobacco  in the last 2 yrs please stop smoking, stop any regular Alcohol  and or any Recreational drug use.  On your next visit with your primary care physician please Get Medicines reviewed and adjusted.  Please request your Prim.MD to go over all Hospital Tests and Procedure/Radiological results at the follow up, please get all Hospital records sent to your Prim MD by signing hospital release before you go home.  If you experience worsening of your admission symptoms, develop shortness of breath, life threatening emergency, suicidal or homicidal thoughts you must seek medical attention immediately by calling 911 or calling your MD immediately  if symptoms less severe.  You Must read complete instructions/literature along with all the possible adverse reactions/side effects for all the Medicines you take and that have been prescribed to you. Take any new Medicines after you have completely understood and accpet all the possible adverse reactions/side effects.   Increase activity slowly   Complete by: As directed       Discharge Medications   Allergies as of 09/19/2019      Reactions   Iodine Shortness Of Breath, Rash   Actonel [risedronate Sodium]    Amlodipine    Codeine    Flexeril [cyclobenzaprine]    Lipitor [atorvastatin]    Losartan Potassium-hctz    Penicillins    Singulair [montelukast Sodium]    Sulfa Antibiotics    Zafirlukast       Medication List    STOP taking these medications   diltiazem 180 MG 24 hr capsule Commonly known as: TIAZAC   donepezil 5 MG tablet Commonly known as: ARICEPT   hydrochlorothiazide 25 MG tablet Commonly known as: HYDRODIURIL   irbesartan 300 MG tablet Commonly known as: AVAPRO     levothyroxine 75 MCG tablet Commonly known as: SYNTHROID     TAKE these medications   aspirin 81 MG EC tablet Take 1 tablet (81 mg total) by mouth daily. Swallow whole. Start taking on: September 20, 2019   cyanocobalamin 1000 MCG tablet Take 1 tablet (1,000 mcg total) by mouth daily. Start taking on: September 21, 2019   isosorbide-hydrALAZINE 20-37.5 MG tablet Commonly known as:  BIDIL Take 1 tablet by mouth 3 (three) times daily.   lovastatin 20 MG tablet Commonly known as: MEVACOR Take 1 tablet by mouth daily.        Follow-up Information    PCP. Schedule an appointment as soon as possible for a visit in 1 week(s).        [facility]. Schedule an appointment as soon as possible for a visit in 1 week(s).   Contact information: [address] [place] [address] [phone]       [facility]. Schedule an appointment as soon as possible for a visit in 1 week(s).   Why: Mild dementia, TIA Contact information: [address]     [address] [place] [address] [phone]       Jake Bathe, MD. Schedule an appointment as soon as possible for a visit in 1 week(s).   Specialty: Cardiology Why: CHF Contact information: [address] [place] [address] [phone]               Major procedures and Radiology Reports - PLEASE review detailed and final reports thoroughly  -        CT Head Wo Contrast  Result Date: 09/16/2019 CLINICAL DATA:  Slurred speech since this morning. Left shoulder pain for a few weeks. EXAM: CT HEAD WITHOUT CONTRAST TECHNIQUE: Contiguous axial images were obtained from the base of the skull through the vertex without intravenous contrast. COMPARISON:  November 24, 2017 FINDINGS: Brain: No subdural, epidural, or subarachnoid hemorrhage. Ventricles and sulci are prominent but stable. Chronic white matter changes are identified. No  acute cortical ischemia or infarct. No mass effect or midline shift. Cerebellum, brainstem, and basal cisterns are normal. Vascular: Calcified atherosclerosis is seen in the intracranial carotids. Skull: Normal. Negative for fracture or focal lesion. Sinuses/Orbits: No acute finding. Other: None. IMPRESSION: No acute intracranial abnormalities identified. Chronic white matter changes. Electronically Signed   By: Gerome Sam III M.D   On: 09/16/2019 14:38   MR BRAIN WO CONTRAST  Result Date: 09/17/2019 CLINICAL DATA:  Neuro deficit. Acute stroke suspected. Altered mental status, confusion. Baseline dementia. EXAM: MRI HEAD WITHOUT CONTRAST TECHNIQUE: Multiplanar, multiecho pulse sequences of the brain and surrounding structures were obtained without intravenous contrast. COMPARISON:  CT head without contrast 09/16/2019. MRI head without contrast 07/13/2017 at [facility] FINDINGS: Brain: Allowing for differences in magnets, moderate periventricular and subcortical T2 hyperintensities are stable. Moderate atrophy is stable. The ventricles are proportionate to the degree of atrophy. No acute infarct, hemorrhage, or mass lesion is present. No significant extraaxial fluid collection is present. The internal auditory canals are within normal limits. The brainstem and cerebellum are within normal limits. Vascular: Flow is present in the major intracranial arteries. Skull and upper cervical spine: The craniocervical junction is normal. Heterogeneous marrow signal is present in the cervical spine. While this may be secondary to degenerative disease, metastatic disease or melanoma is not excluded. Marrow signal in the calvarium appears to be normal. Sinuses/Orbits: Minimal mucosal thickening is present within anterior ethmoid air cells. Sinuses are otherwise clear. Bilateral lens replacements are noted. IMPRESSION: 1. No acute intracranial abnormality or significant interval change. 2.  Stable moderate atrophy and white matter disease. This likely reflects the sequela of chronic microvascular ischemia. 3. Heterogeneous marrow signal in the cervical spine. While this may be secondary to degenerative disease, metastatic disease or melanoma is not excluded. Recommend  clinical correlation. Electronically Signed   By: Marin Roberts M.D.   On: 09/17/2019 14:38   ECHOCARDIOGRAM COMPLETE  Result Date: 09/17/2019    ECHOCARDIOGRAM REPORT   Patient Name:   Andrea Warner Date of Exam: 09/17/2019 Medical Rec #:  [id number]  Height:       63.0 in Accession #:    [id number] Weight:       112.0 lb Date of Birth:  05-18-1931 BSA:          1.511 m Patient Age:    87 years   BP:           108/47 mmHg Patient Gender: F          HR:           50 bpm. Exam Location:  Inpatient Procedure: 2D Echo Indications:    stroke 434.91  History:        Patient has no prior history of Echocardiogram examinations.                 Alzheimers; Risk Factors:Diabetes, Hypertension and                 Dyslipidemia.  Sonographer:    Delcie Roch Referring Phys: [id number] VISHAL R PATEL  Sonographer Comments: Image acquisition challenging due to respiratory motion. IMPRESSIONS  1. Left ventricular ejection fraction, by estimation, is 35 to 40%. The left ventricle has moderately decreased function. The left ventricle has no regional wall motion abnormalities. Left ventricular diastolic parameters are consistent with Grade I diastolic dysfunction (impaired relaxation). There is akinesis of the left ventricular, basal-mid anteroseptal wall, inferoseptal wall and inferior wall.  2. Right ventricular systolic function is normal. The right ventricular size is normal. There is normal pulmonary artery systolic pressure.  3. The mitral valve is normal in structure. Mild mitral valve regurgitation. No evidence of mitral stenosis.  4. The aortic valve is tricuspid. Aortic valve regurgitation is mild. Mild to moderate aortic valve  sclerosis/calcification is present, without any evidence of aortic stenosis. Aortic regurgitation PHT measures 654 msec.  5. The inferior vena cava is normal in size with greater than 50% respiratory variability, suggesting right atrial pressure of 3 mmHg. FINDINGS  Left Ventricle: Left ventricular ejection fraction, by estimation, is 35 to 40%. The left ventricle has moderately decreased function. The left ventricle has no regional wall motion abnormalities. The left ventricular internal cavity size was normal in size. There is no left ventricular hypertrophy. Abnormal (paradoxical) septal motion, consistent with left bundle branch block. Left ventricular diastolic parameters are consistent with Grade I diastolic dysfunction (impaired relaxation). Normal left ventricular filling pressure. Right Ventricle: The right ventricular size is normal. No increase in right ventricular wall thickness. Right ventricular systolic function is normal. There is normal pulmonary artery systolic pressure. The tricuspid regurgitant velocity is 1.75 m/s, and  with an assumed right atrial pressure of 3 mmHg, the estimated right ventricular systolic pressure is 15.2 mmHg. Left Atrium: Left atrial size was normal in size. Right Atrium: Right atrial size was normal in size. Pericardium: There is no evidence of pericardial effusion. Mitral Valve: The mitral valve is normal in structure. Normal mobility of the mitral valve leaflets. Mild mitral valve regurgitation. No evidence of mitral valve stenosis. Tricuspid Valve: The tricuspid valve is normal in structure. Tricuspid valve regurgitation is trivial. No evidence of tricuspid stenosis. Aortic Valve: The aortic valve is tricuspid. Aortic valve regurgitation is mild. Aortic regurgitation PHT measures 654 msec. Mild to moderate aortic  valve sclerosis/calcification is present, without any evidence of aortic stenosis. Pulmonic Valve: The pulmonic valve was normal in structure. Pulmonic valve  regurgitation is not visualized. No evidence of pulmonic stenosis. Aorta: The aortic root is normal in size and structure. Venous: The inferior vena cava is normal in size with greater than 50% respiratory variability, suggesting right atrial pressure of 3 mmHg. IAS/Shunts: No atrial level shunt detected by color flow Doppler.  LEFT VENTRICLE PLAX 2D LVIDd:         4.70 cm     Diastology LVIDs:         3.80 cm     LV e' lateral:   6.42 cm/s LV PW:         0.90 cm     LV E/e' lateral: 6.7 LV IVS:        0.70 cm     LV e' medial:    4.03 cm/s LVOT diam:     1.70 cm     LV E/e' medial:  10.6 LV SV:         50 LV SV Index:   33 LVOT Area:     2.27 cm  LV Volumes (MOD) LV vol d, MOD A4C: 89.7 ml LV vol s, MOD A4C: 58.1 ml LV SV MOD A4C:     89.7 ml RIGHT VENTRICLE RV S prime:     8.16 cm/s LEFT ATRIUM             Index       RIGHT ATRIUM           Index LA diam:        3.30 cm 2.18 cm/m  RA Area:     10.20 cm LA Vol (A2C):   45.0 ml 29.78 ml/m RA Volume:   20.30 ml  13.43 ml/m LA Vol (A4C):   32.1 ml 21.24 ml/m LA Biplane Vol: 38.6 ml 25.54 ml/m  AORTIC VALVE LVOT Vmax:   109.00 cm/s LVOT Vmean:  65.400 cm/s LVOT VTI:    0.220 m AI PHT:      654 msec  AORTA Ao Root diam: 2.70 cm MITRAL VALVE                 TRICUSPID VALVE MV Area (PHT): 1.94 cm      TR Peak grad:   12.2 mmHg MV Decel Time: 391 msec      TR Vmax:        175.00 cm/s MR Peak grad:    94.1 mmHg MR Mean grad:    65.0 mmHg   SHUNTS MR Vmax:         485.00 cm/s Systemic VTI:  0.22 m MR Vmean:        382.0 cm/s  Systemic Diam: 1.70 cm MR PISA:         0.57 cm MR PISA Eff ROA: 5 mm MR PISA Radius:  0.30 cm MV E velocity: 42.80 cm/s MV A velocity: 66.40 cm/s MV E/A ratio:  0.64 Armanda Magic MD Electronically signed by Armanda Magic MD Signature Date/Time: 09/17/2019/1:02:56 PM    Final    VAS US CAROTID (at [facility] and [facility] only)  Result Date: 09/18/2019 Carotid Arterial Duplex Study Indications:       Speech disturbance. Risk Factors:      Hypertension,  hyperlipidemia, Diabetes. Other Factors:     Alzheimer's. Comparison Study:  No prior study on file for comparison Performing Technologist: Sherren Kerns RVS  Examination Guidelines: A complete evaluation  includes B-mode imaging, spectral Doppler, color Doppler, and power Doppler as needed of all accessible portions of each vessel. Bilateral testing is considered an integral part of a complete examination. Limited examinations for reoccurring indications may be performed as noted.  Right Carotid Findings: +----------+--------+--------+--------+------------------+------------------+           PSV cm/sEDV cm/sStenosisPlaque DescriptionComments           +----------+--------+--------+--------+------------------+------------------+ CCA Prox  57      13                                                   +----------+--------+--------+--------+------------------+------------------+ CCA Distal87      12                                intimal thickening +----------+--------+--------+--------+------------------+------------------+ ICA Prox  61      16              heterogenous                         +----------+--------+--------+--------+------------------+------------------+ ICA Distal71      22                                                   +----------+--------+--------+--------+------------------+------------------+ ECA       62      16                                                   +----------+--------+--------+--------+------------------+------------------+ +----------+--------+-------+--------+-------------------+           PSV cm/sEDV cmsDescribeArm Pressure (mmHG) +----------+--------+-------+--------+-------------------+ Subclavian119                                        +----------+--------+-------+--------+-------------------+ +---------+--------+--+--------+--+ VertebralPSV cm/s78EDV cm/s10 +---------+--------+--+--------+--+  Left Carotid  Findings: +----------+--------+--------+--------+------------------+--------+           PSV cm/sEDV cm/sStenosisPlaque DescriptionComments +----------+--------+--------+--------+------------------+--------+ CCA Prox  104     20                                         +----------+--------+--------+--------+------------------+--------+ CCA Distal70      14                                         +----------+--------+--------+--------+------------------+--------+ ICA Prox  60      16              heterogenous               +----------+--------+--------+--------+------------------+--------+ ICA Distal55      17                                         +----------+--------+--------+--------+------------------+--------+  ECA       24      1                                          +----------+--------+--------+--------+------------------+--------+ +----------+--------+--------+--------+-------------------+           PSV cm/sEDV cm/sDescribeArm Pressure (mmHG) +----------+--------+--------+--------+-------------------+ [id number]                                          +----------+--------+--------+--------+-------------------+ +---------+--------+--+--------+--+ VertebralPSV cm/s70EDV cm/s11 +---------+--------+--+--------+--+   Summary: Right Carotid: The extracranial vessels were near-normal with only minimal wall                thickening or plaque. Left Carotid: The extracranial vessels were near-normal with only minimal wall               thickening or plaque. Vertebrals:  Bilateral vertebral arteries demonstrate antegrade flow. Subclavians: Normal flow hemodynamics were seen in bilateral subclavian              arteries. *See table(s) above for measurements and observations.  Electronically signed by Antony Contras MD on 09/18/2019 at 1:34:09 PM.    Final     Micro Results     Recent Results (from the past 240 hour(s))  SARS Coronavirus 2 by RT PCR  (hospital order, performed in [facility] hospital lab) Nasopharyngeal Nasopharyngeal Swab     Status: None   Collection Time: 09/16/19  3:21 PM   Specimen: Nasopharyngeal Swab  Result Value Ref Range Status   SARS Coronavirus 2 NEGATIVE NEGATIVE Final    Comment: (NOTE) SARS-CoV-2 target nucleic acids are NOT DETECTED.  The SARS-CoV-2 RNA is generally detectable in upper and lower respiratory specimens during the acute phase of infection. The lowest concentration of SARS-CoV-2 viral copies this assay can detect is 250 copies / mL. A negative result does not preclude SARS-CoV-2 infection and should not be used as the sole basis for treatment or other patient management decisions.  A negative result may occur with improper specimen collection / handling, submission of specimen other than nasopharyngeal swab, presence of viral mutation(s) within the areas targeted by this assay, and inadequate number of viral copies (<250 copies / mL). A negative result must be combined with clinical observations, patient history, and epidemiological information.  Fact Sheet for Patients:   StrictlyIdeas.no  Fact Sheet for Healthcare Providers: BankingDealers.co.za  This test is not yet approved or  cleared by the [place] FDA and has been authorized for detection and/or diagnosis of SARS-CoV-2 by FDA under an Emergency Use Authorization (EUA).  This EUA will remain in effect (meaning this test can be used) for the duration of the COVID-19 declaration under Section 564(b)(1) of the Act, 21 U.S.C. section 360bbb-3(b)(1), unless the authorization is terminated or revoked sooner.  Performed at [facility], [address] [place], [place] [address]   Culture, Urine     Status: Abnormal   Collection Time: 09/16/19  9:30 PM   Specimen: Urine, Random  Result Value Ref Range Status   Specimen Description URINE, RANDOM  Final   Special  Requests   Final    NONE Performed at [facility], [address] [place], [place] [address]    Culture (A)  Final  20,000 COLONIES/mL MULTIPLE SPECIES PRESENT, SUGGEST RECOLLECTION   Report Status 09/18/2019 FINAL  Final    Today   Subjective    Andrea Warner today has no headache,no chest abdominal pain,no new weakness tingling or numbness, feels much better wants to go home today.     Objective   Blood pressure (!) 131/54, pulse 60, temperature 97.7 F (36.5 C), temperature source Oral, resp. rate (!) 25, weight 52.5 kg, SpO2 98 %.   Intake/Output Summary (Last 24 hours) at 09/19/2019 1011 Last data filed at 09/19/2019 0407 Gross per 24 hour  Intake 530 ml  Output 600 ml  Net -70 ml    Exam  Awake Alert, No new F.N deficits, Normal affect [place].AT,PERRAL Supple Neck,No JVD, No cervical lymphadenopathy appriciated.  Symmetrical Chest wall movement, Good air movement bilaterally, CTAB RRR,No Gallops,Rubs or new Murmurs, No Parasternal Heave +ve B.Sounds, Abd Soft, Non tender, No organomegaly appriciated, No rebound -guarding or rigidity. No Cyanosis, Clubbing or edema, No new Rash or bruise   Data Review   CBC w Diff:  Lab Results  Component Value Date   WBC 13.8 (H) 09/19/2019   HGB 9.6 (L) 09/19/2019   HCT 29.1 (L) 09/19/2019   PLT 232 09/19/2019   LYMPHOPCT 14 09/19/2019   MONOPCT 5 09/19/2019   EOSPCT 6 09/19/2019   BASOPCT 0 09/19/2019    CMP:  Lab Results  Component Value Date   NA 133 (L) 09/19/2019   K 4.0 09/19/2019   CL 104 09/19/2019   CO2 21 (L) 09/19/2019   BUN 13 09/19/2019   CREATININE 1.10 (H) 09/19/2019   PROT 6.0 (L) 09/19/2019   ALBUMIN 2.4 (L) 09/19/2019   BILITOT 0.6 09/19/2019   ALKPHOS 57 09/19/2019   AST 26 09/19/2019   ALT 9 09/19/2019  . Lab Results  Component Value Date   HGBA1C 6.6 (H) 09/17/2019   Lipid Panel     Component Value Date/Time   CHOL 151 09/17/2019 0233   TRIG 63 09/17/2019 0233   HDL 41  09/17/2019 0233   CHOLHDL 3.7 09/17/2019 0233   VLDL 13 09/17/2019 0233   LDLCALC 97 09/17/2019 0233   Lab Results  Component Value Date   TSH 0.086 (L) 09/17/2019     Total Time in preparing paper work, data evaluation and todays exam - 35 minutes  Susa Raring M.D on 09/19/2019 at 10:11 AM  [facility]   Office  [phone]

## 2019-09-19 NOTE — ED Triage Notes (Signed)
Per EMs- pt was DC home with family from hospital. Pt family reports pt walked up 10 stairs and felt tired after. Sat down and was blank staring for about 3 mins unresponsive. Upon EMs arrival they noted facial drooping/weakness. Hypotensive with EMS. Symptoms resolved upon arrival to CT scan. Pt had 1 episode of emesis in CT scanner.

## 2019-09-20 ENCOUNTER — Encounter (HOSPITAL_COMMUNITY): Payer: Self-pay | Admitting: Internal Medicine

## 2019-09-20 DIAGNOSIS — G309 Alzheimer's disease, unspecified: Secondary | ICD-10-CM | POA: Diagnosis not present

## 2019-09-20 DIAGNOSIS — R4189 Other symptoms and signs involving cognitive functions and awareness: Secondary | ICD-10-CM | POA: Diagnosis not present

## 2019-09-20 LAB — BASIC METABOLIC PANEL
Anion gap: 10 (ref 5–15)
BUN: 14 mg/dL (ref 8–23)
CO2: 21 mmol/L — ABNORMAL LOW (ref 22–32)
Calcium: 9.3 mg/dL (ref 8.9–10.3)
Chloride: 104 mmol/L (ref 98–111)
Creatinine, Ser: 1.19 mg/dL — ABNORMAL HIGH (ref 0.44–1.00)
GFR calc Af Amer: 48 mL/min — ABNORMAL LOW (ref >60)
GFR calc non Af Amer: 42 mL/min — ABNORMAL LOW (ref >60)
Glucose, Bld: 87 mg/dL (ref 70–99)
Potassium: 4.4 mmol/L (ref 3.5–5.1)
Sodium: 135 mmol/L (ref 135–145)

## 2019-09-20 LAB — CBC
HCT: 31.2 % — ABNORMAL LOW (ref 36.0–46.0)
Hemoglobin: 10.3 g/dL — ABNORMAL LOW (ref 12.0–15.0)
MCH: 28.5 pg (ref 26.0–34.0)
MCHC: 33 g/dL (ref 30.0–36.0)
MCV: 86.2 fL (ref 80.0–100.0)
Platelets: 227 10*3/uL (ref 150–400)
RBC: 3.62 MIL/uL — ABNORMAL LOW (ref 3.87–5.11)
RDW: 15.9 % — ABNORMAL HIGH (ref 11.5–15.5)
WBC: 15.7 10*3/uL — ABNORMAL HIGH (ref 4.0–10.5)
nRBC: 0.2 % (ref 0.0–0.2)

## 2019-09-20 LAB — GLUCOSE, CAPILLARY: Glucose-Capillary: 84 mg/dL (ref 70–99)

## 2019-09-20 MED ORDER — ENOXAPARIN SODIUM 30 MG/0.3ML ~~LOC~~ SOLN: 30.0000 mg | SUBCUTANEOUS | Status: DC

## 2019-09-20 NOTE — Progress Notes (Signed)
   09/20/19 1522  Clinical Encounter Type  Visited With Patient and family together  Visit Type Initial  Referral From Nurse  Consult/Referral To Chaplain  Spiritual Encounters  Spiritual Needs Prayer   Chaplain responded for consult for prayer. Treniece's son initially said, "I think you have the wrong room. We did not request a chaplain." Aerilyn inquired again who the chaplain was, and after realizing chaplain had arrived said, "Yes, hi chaplain! I requested a chaplain last night. Thank you for coming." Chaplain offered ministry of presence and prayer. Chaplain also learned that Reeta's son is a Optician, dispensing in [employer] [place]. [place] remains available for support as needs arise.   Chaplain Resident, Amado Coe, M Div [phone] on-call pager

## 2019-09-20 NOTE — TOC Transition Note (Signed)
Transition of Care [facility]) - CM/SW Discharge Note   Patient Details  Name: Kahealani Yankovich MRN: [id number] Date of Birth: 1931/04/29  Transition of Care [facility]) CM/SW Contact:  Kermit Balo, RN Phone Number: 09/20/2019, 2:55 PM   Clinical Narrative:    Pt discharging home with [facility] services through [facility]. [facility] already arranged for patient at last visit. CM updated Britney with [facility].  Pt is going to sons home so she will have 24 hour supervision: [address] in [facility].  No DME needs.  Son to provide transportation home.   Final next level of care: Home w Home Health Services Barriers to Discharge: No Barriers Identified   Patient Goals and CMS Choice   CMS Medicare.gov Compare Post Acute Care list provided to:: Patient Represenative (must comment) Choice offered to / list presented to : Adult Children  Discharge Placement                       Discharge Plan and Services                          HH Arranged: RN, PT [facility] Agency: [facility] Date [facility] Agency Contacted: 09/20/19   Representative spoke with at [facility] Agency: Brayton El  Social Determinants of Health (SDOH) Interventions     Readmission Risk Interventions No flowsheet data found.

## 2019-09-20 NOTE — Discharge Summary (Signed)
Physician Discharge Summary  Andrea Warner MVH:[id number] DOB: 1931-11-02 DOA: 09/19/2019  PCP: Lester [place]., MD  Admit date: 09/19/2019 Discharge date: 09/20/2019  Admitted From: Home Disposition:  Home  Discharge Condition:Stable CODE STATUS:DNR Diet recommendation: Heart Healthy  Brief/Interim Summary:  HPI: Andrea Warner is a 86 y.o. female with medical history significant of hypertension, hyperlipidemia, hypothyroidism, Alzheimer's dementia, diabetes mellitus type 2, and depression who presented after having an episode of unresponsiveness at home.  She just was discharged from the hospital today for suspected TIA.  Family gives history as the patient has some issues with her memory and does not recall what happened.  She came up 10 steps into the house and complained of feeling tired. She was able to sit in a chair at the kitchen table and laid her head down.  Family noted that her leg was shaking and then she became unresponsive and was drooling out of the side of her mouth.  Patient was unresponsive for about 1 minute and then came to.  She did not fall or hit her head.  Prior to leaving the hospital the patient's diastolic blood pressures were reported to be in the 30s.  Family asked if it was safe for her to go home at that time, but they were told that her pressures have been similar throughout most of the hospital stay.  Records note that patient also had signs of a urinary tract infection, but cultures showed only 20,000 colonies with multiple species present for which recollection was suggested.  Patient currently denies having any complaints.  ED Course: Patient was seen as a code stroke.  Initially evaluated by neurology and had CT scan of the brain that did not show any acute signs of a stroke.  She was noted to be afebrile with blood pressure as low as 96/43 with improvement after given 500 mL normal saline IV fluids.  Labs significant for WBC 15.6, hemoglobin 10.9, creatinine 1.2,  and glucose 191.   Neurology had recommended obtaining MRI of the brain.  [facility] called to admit.  Hospital course: Her hospital course remained stable.  MRI/MRI of the brain did not show any acute intracranial abnormalities.  Her carotid Doppler done on previous admission did not show any significant stenosis.  Her syncopal episode was most likely associated with hypotension at home.  Antihypertensives were stopped after the admission.  Her blood pressure has been stable without any antihypertensives.  This morning her orthostatic vitals were negative.  She was seen by physical therapy again on this admission and recommended home health.  Patient is medically stable for discharge to home with home health today.  I have requested cardiology to set up an outpatient appointment.  She will also follow-up with [facility] neurology as an outpatient.  I also called the son and discussed our plan and answered his questions.  Following problems were addressed during her hospitalization:  Syncopal episode: Most likely associated with hypotensive episode.  Currently blood pressure stable without antihypertensives.  Seen by physical therapy and recommended home health on discharge.  Orthostatic vitals check this morning, negative.  MRI/MRA of the brain did not show any acute intracranial abnormalities.  Prolonged QT: Last EKG showed QTC of 572.  Her potassium is normal.  She needs to repeat EKG as an outpatient to check for follow-up QTC.  She is not on any QT prolonging drugs at present.  Leukocytosis: Most likely reactive.  No indication for antibiotic therapy.  Her urine culture on last admission did  not show any significant growth.  Denies any dysuria.  Afebrile.  Normocytic anemia: Stable.  Hemoglobin 10.1 which appears near patient's baseline.  Recent TIA: Patient had been discharged from the hospital and recommended to take aspirin donepezil daily and in addition to statin.Continue aspirin and statin.She  should follow up with cardiology as outpatient.  Plan for outpatient cardiac monitoring for occult arrhythmia.  Alzheimer's dementia: Patient noted to have mild dementia.  Donepezil stopped during last hospitalization  Diabetes mellitus type 2: Patient had been diet controlled.  Hemoglobin A1c on 6/20 was 6.6.Continue heart healthy and carb modified diet.  Hypothyroidism: On 6/20 patient was found to have TSH of 0.086 with free T4 1.51.  Patient had been on 75 mcg of Synthroid which were discontinued. Recommended PCP to monitor TSH and then resume lower dose Synthroid when medically appropriate  Chronic kidney disease stage III: Stable.  Hyperlipidemia: On lovastatin 20 mg daily.  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 I have notified cardiology for outpatient cardiology follow-up.   She was discharged on BiDil on last admission but came with hypotension and  Syncopal episode.     Discharge Diagnoses:  Active Problems:   Alzheimer's dementia ([place])   Type 2 diabetes mellitus (HCC)   Normocytic anemia   Episode of unresponsiveness   Acute lower UTI   Leukocytosis   Transient hypotension   Elevated troponin   Prolonged QT interval   DNR (do not resuscitate)    Discharge Instructions  Discharge Instructions    Diet - low sodium heart healthy   Complete by: As directed    Discharge instructions   Complete by: As directed    1)Please take your medications as instructed. 2)Follow up with your PCP in a week.  Do a CBC, BMP test during the follow-up 3)Follow up with [facility] neurology in 4 weeks.  Name and number of the provider group has been attached 4)You will be called by cardiology for follow-up outpatient appointment.   Increase activity slowly   Complete by: As directed      Allergies as of  09/20/2019      Reactions   Amlodipine Swelling, Rash   Codeine Swelling, Rash   Cyclobenzaprine Hives   Iodinated Diagnostic Agents Anaphylaxis, Shortness Of Breath, Swelling, Other (See Comments)   "Swelling around the throat"   Iodine Shortness Of Breath, Swelling, Rash   Shellfish-derived Products Anaphylaxis, Shortness Of Breath, Swelling, Other (See Comments)   "Swelling around the throat"   Actonel [risedronate Sodium] Other (See Comments)   Reaction not recalled, but "I have a lot of allergies"   Valsartan Swelling   Atorvastatin Rash   Losartan Potassium-hctz Swelling, Rash   Metoprolol Rash   Penicillins Rash   Pravastatin Itching, Swelling, Rash      Risedronate Rash   Singulair [montelukast Sodium] Rash   Sulfa Antibiotics Rash   Zafirlukast Rash      Medication List    STOP taking these medications   isosorbide-hydrALAZINE 20-37.5 MG tablet Commonly known as: BIDIL     TAKE these medications   aspirin 81 MG EC tablet Take 1 tablet (81 mg total) by mouth daily. Swallow whole.   cyanocobalamin 1000 MCG tablet Take 1 tablet (1,000 mcg total) by mouth daily. Start taking on: September 21, 2019   lovastatin 20 MG tablet Commonly known as: MEVACOR Take 20 mg by mouth  daily.       Follow-up Information    Lester Hartman., MD. Schedule an appointment as soon as possible for a visit in 1 week(s).   Specialty: Family Medicine Contact information: [address] [place] [address] [phone]        [facility]. Schedule an appointment as soon as possible for a visit in 4 week(s).   Specialty: Neurology Contact information: [address] [place] [address] [phone]             Allergies  Allergen Reactions  . Amlodipine Swelling and Rash  . Codeine Swelling and Rash  . Cyclobenzaprine Hives  . Iodinated Diagnostic Agents Anaphylaxis, Shortness Of Breath, Swelling and Other (See Comments)     "Swelling around the throat"  . Iodine Shortness Of Breath, Swelling and Rash  . Shellfish-Derived Products Anaphylaxis, Shortness Of Breath, Swelling and Other (See Comments)    "Swelling around the throat"  . Actonel [Risedronate Sodium] Other (See Comments)    Reaction not recalled, but "I have a lot of allergies"  . Valsartan Swelling  . Atorvastatin Rash  . Losartan Potassium-Hctz Swelling and Rash  . Metoprolol Rash  . Penicillins Rash  . Pravastatin Itching, Swelling and Rash        . Risedronate Rash  . Singulair [Montelukast Sodium] Rash  . Sulfa Antibiotics Rash  . Zafirlukast Rash    Consultations:  None   Procedures/Studies: CT Head Wo Contrast  Result Date: 09/16/2019 CLINICAL DATA:  Slurred speech since this morning. Left shoulder pain for a few weeks. EXAM: CT HEAD WITHOUT CONTRAST TECHNIQUE: Contiguous axial images were obtained from the base of the skull through the vertex without intravenous contrast. COMPARISON:  November 24, 2017 FINDINGS: Brain: No subdural, epidural, or subarachnoid hemorrhage. Ventricles and sulci are prominent but stable. Chronic white matter changes are identified. No acute cortical ischemia or infarct. No mass effect or midline shift. Cerebellum, brainstem, and basal cisterns are normal. Vascular: Calcified atherosclerosis is seen in the intracranial carotids. Skull: Normal. Negative for fracture or focal lesion. Sinuses/Orbits: No acute finding. Other: None. IMPRESSION: No acute intracranial abnormalities identified. Chronic white matter changes. Electronically Signed   By: Gerome Sam III M.D   On: 09/16/2019 14:38   MR ANGIO HEAD WO CONTRAST  Result Date: 09/19/2019 CLINICAL DATA:  Recurrent syncope EXAM: MRI HEAD WITHOUT CONTRAST MRA HEAD WITHOUT CONTRAST TECHNIQUE: Multiplanar, multiecho pulse sequences of the brain and surrounding structures were obtained without intravenous contrast. Angiographic images of the head were obtained  using MRA technique without contrast. COMPARISON:  None. FINDINGS: MRI HEAD FINDINGS BRAIN: No acute infarct, acute hemorrhage or extra-axial collection. Early confluent hyperintense T2-weighted signal of the periventricular and deep white matter, most commonly due to chronic ischemic microangiopathy. There is generalized atrophy without lobar predilection. No chronic microhemorrhage. Normal midline structures. VASCULAR: Major flow voids are preserved. SKULL AND UPPER CERVICAL SPINE: Normal calvarium and skull base. Visualized upper cervical spine and soft tissues are normal. SINUSES/ORBITS: No paranasal sinus fluid levels or advanced mucosal thickening. No mastoid or middle ear effusion. Normal orbits. MRA HEAD FINDINGS POSTERIOR CIRCULATION: --Vertebral arteries: Normal V4 segments. --Inferior cerebellar arteries: Normal. --Basilar artery: Normal. --Superior cerebellar arteries: Normal. --Posterior cerebral arteries: Normal. Both are predominantly supplied by the posterior communicating arteries (p-comm). ANTERIOR CIRCULATION: --Intracranial internal carotid arteries: Normal. --Anterior cerebral arteries (ACA): Normal. Both A1 segments are present. Patent anterior communicating artery (a-comm). --Middle cerebral arteries (MCA): Normal. IMPRESSION: 1. No  acute intracranial abnormality. 2. Generalized atrophy and chronic ischemic microangiopathy. 3. Normal intracranial MRA. Electronically Signed   By: Deatra Robinson M.D.   On: 09/19/2019 21:43   MR BRAIN WO CONTRAST  Result Date: 09/19/2019 CLINICAL DATA:  Recurrent syncope EXAM: MRI HEAD WITHOUT CONTRAST MRA HEAD WITHOUT CONTRAST TECHNIQUE: Multiplanar, multiecho pulse sequences of the brain and surrounding structures were obtained without intravenous contrast. Angiographic images of the head were obtained using MRA technique without contrast. COMPARISON:  None. FINDINGS: MRI HEAD FINDINGS BRAIN: No acute infarct, acute hemorrhage or extra-axial collection.  Early confluent hyperintense T2-weighted signal of the periventricular and deep white matter, most commonly due to chronic ischemic microangiopathy. There is generalized atrophy without lobar predilection. No chronic microhemorrhage. Normal midline structures. VASCULAR: Major flow voids are preserved. SKULL AND UPPER CERVICAL SPINE: Normal calvarium and skull base. Visualized upper cervical spine and soft tissues are normal. SINUSES/ORBITS: No paranasal sinus fluid levels or advanced mucosal thickening. No mastoid or middle ear effusion. Normal orbits. MRA HEAD FINDINGS POSTERIOR CIRCULATION: --Vertebral arteries: Normal V4 segments. --Inferior cerebellar arteries: Normal. --Basilar artery: Normal. --Superior cerebellar arteries: Normal. --Posterior cerebral arteries: Normal. Both are predominantly supplied by the posterior communicating arteries (p-comm). ANTERIOR CIRCULATION: --Intracranial internal carotid arteries: Normal. --Anterior cerebral arteries (ACA): Normal. Both A1 segments are present. Patent anterior communicating artery (a-comm). --Middle cerebral arteries (MCA): Normal. IMPRESSION: 1. No acute intracranial abnormality. 2. Generalized atrophy and chronic ischemic microangiopathy. 3. Normal intracranial MRA. Electronically Signed   By: Deatra Robinson M.D.   On: 09/19/2019 21:43   MR BRAIN WO CONTRAST  Result Date: 09/17/2019 CLINICAL DATA:  Neuro deficit. Acute stroke suspected. Altered mental status, confusion. Baseline dementia. EXAM: MRI HEAD WITHOUT CONTRAST TECHNIQUE: Multiplanar, multiecho pulse sequences of the brain and surrounding structures were obtained without intravenous contrast. COMPARISON:  CT head without contrast 09/16/2019. MRI head without contrast 07/13/2017 at [facility] FINDINGS: Brain: Allowing for differences in magnets, moderate periventricular and subcortical T2 hyperintensities are stable. Moderate atrophy is stable. The ventricles are  proportionate to the degree of atrophy. No acute infarct, hemorrhage, or mass lesion is present. No significant extraaxial fluid collection is present. The internal auditory canals are within normal limits. The brainstem and cerebellum are within normal limits. Vascular: Flow is present in the major intracranial arteries. Skull and upper cervical spine: The craniocervical junction is normal. Heterogeneous marrow signal is present in the cervical spine. While this may be secondary to degenerative disease, metastatic disease or melanoma is not excluded. Marrow signal in the calvarium appears to be normal. Sinuses/Orbits: Minimal mucosal thickening is present within anterior ethmoid air cells. Sinuses are otherwise clear. Bilateral lens replacements are noted. IMPRESSION: 1. No acute intracranial abnormality or significant interval change. 2. Stable moderate atrophy and white matter disease. This likely reflects the sequela of chronic microvascular ischemia. 3. Heterogeneous marrow signal in the cervical spine. While this may be secondary to degenerative disease, metastatic disease or melanoma is not excluded. Recommend clinical correlation. Electronically Signed   By: Marin Roberts M.D.   On: 09/17/2019 14:38   DG Chest Portable 1 View  Result Date: 09/19/2019 CLINICAL DATA:  Sepsis EXAM: PORTABLE CHEST 1 VIEW COMPARISON:  07/13/2017 FINDINGS: Cardiac shadow is within normal limits. Aortic calcifications are seen. Ovoid area of scarring is again noted in the right upper lobe stable from prior exams dating back to 2014. No focal infiltrate or sizable effusion is seen. No bony abnormality is noted. IMPRESSION: No acute abnormality  noted.  No acute abnormality seen. Electronically Signed   By: Alcide Clever M.D.   On: 09/19/2019 15:38   ECHOCARDIOGRAM COMPLETE  Result Date: 09/17/2019    ECHOCARDIOGRAM REPORT   Patient Name:   AMYLA HEFFNER Date of Exam: 09/17/2019 Medical Rec #:  [id number]  Height:       63.0  in Accession #:    [id number] Weight:       112.0 lb Date of Birth:  Jun 12, 1931 BSA:          1.511 m Patient Age:    87 years   BP:           108/47 mmHg Patient Gender: F          HR:           50 bpm. Exam Location:  Inpatient Procedure: 2D Echo Indications:    stroke 434.91  History:        Patient has no prior history of Echocardiogram examinations.                 Alzheimers; Risk Factors:Diabetes, Hypertension and                 Dyslipidemia.  Sonographer:    Delcie Roch Referring Phys: [id number] VISHAL R PATEL  Sonographer Comments: Image acquisition challenging due to respiratory motion. IMPRESSIONS  1. Left ventricular ejection fraction, by estimation, is 35 to 40%. The left ventricle has moderately decreased function. The left ventricle has no regional wall motion abnormalities. Left ventricular diastolic parameters are consistent with Grade I diastolic dysfunction (impaired relaxation). There is akinesis of the left ventricular, basal-mid anteroseptal wall, inferoseptal wall and inferior wall.  2. Right ventricular systolic function is normal. The right ventricular size is normal. There is normal pulmonary artery systolic pressure.  3. The mitral valve is normal in structure. Mild mitral valve regurgitation. No evidence of mitral stenosis.  4. The aortic valve is tricuspid. Aortic valve regurgitation is mild. Mild to moderate aortic valve sclerosis/calcification is present, without any evidence of aortic stenosis. Aortic regurgitation PHT measures 654 msec.  5. The inferior vena cava is normal in size with greater than 50% respiratory variability, suggesting right atrial pressure of 3 mmHg. FINDINGS  Left Ventricle: Left ventricular ejection fraction, by estimation, is 35 to 40%. The left ventricle has moderately decreased function. The left ventricle has no regional wall motion abnormalities. The left ventricular internal cavity size was normal in size. There is no left ventricular hypertrophy.  Abnormal (paradoxical) septal motion, consistent with left bundle branch block. Left ventricular diastolic parameters are consistent with Grade I diastolic dysfunction (impaired relaxation). Normal left ventricular filling pressure. Right Ventricle: The right ventricular size is normal. No increase in right ventricular wall thickness. Right ventricular systolic function is normal. There is normal pulmonary artery systolic pressure. The tricuspid regurgitant velocity is 1.75 m/s, and  with an assumed right atrial pressure of 3 mmHg, the estimated right ventricular systolic pressure is 15.2 mmHg. Left Atrium: Left atrial size was normal in size. Right Atrium: Right atrial size was normal in size. Pericardium: There is no evidence of pericardial effusion. Mitral Valve: The mitral valve is normal in structure. Normal mobility of the mitral valve leaflets. Mild mitral valve regurgitation. No evidence of mitral valve stenosis. Tricuspid Valve: The tricuspid valve is normal in structure. Tricuspid valve regurgitation is trivial. No evidence of tricuspid stenosis. Aortic Valve: The aortic valve is tricuspid. Aortic valve regurgitation is mild. Aortic regurgitation PHT measures 654  msec. Mild to moderate aortic valve sclerosis/calcification is present, without any evidence of aortic stenosis. Pulmonic Valve: The pulmonic valve was normal in structure. Pulmonic valve regurgitation is not visualized. No evidence of pulmonic stenosis. Aorta: The aortic root is normal in size and structure. Venous: The inferior vena cava is normal in size with greater than 50% respiratory variability, suggesting right atrial pressure of 3 mmHg. IAS/Shunts: No atrial level shunt detected by color flow Doppler.  LEFT VENTRICLE PLAX 2D LVIDd:         4.70 cm     Diastology LVIDs:         3.80 cm     LV e' lateral:   6.42 cm/s LV PW:         0.90 cm     LV E/e' lateral: 6.7 LV IVS:        0.70 cm     LV e' medial:    4.03 cm/s LVOT diam:     1.70 cm      LV E/e' medial:  10.6 LV SV:         50 LV SV Index:   33 LVOT Area:     2.27 cm  LV Volumes (MOD) LV vol d, MOD A4C: 89.7 ml LV vol s, MOD A4C: 58.1 ml LV SV MOD A4C:     89.7 ml RIGHT VENTRICLE RV S prime:     8.16 cm/s LEFT ATRIUM             Index       RIGHT ATRIUM           Index LA diam:        3.30 cm 2.18 cm/m  RA Area:     10.20 cm LA Vol (A2C):   45.0 ml 29.78 ml/m RA Volume:   20.30 ml  13.43 ml/m LA Vol (A4C):   32.1 ml 21.24 ml/m LA Biplane Vol: 38.6 ml 25.54 ml/m  AORTIC VALVE LVOT Vmax:   109.00 cm/s LVOT Vmean:  65.400 cm/s LVOT VTI:    0.220 m AI PHT:      654 msec  AORTA Ao Root diam: 2.70 cm MITRAL VALVE                 TRICUSPID VALVE MV Area (PHT): 1.94 cm      TR Peak grad:   12.2 mmHg MV Decel Time: 391 msec      TR Vmax:        175.00 cm/s MR Peak grad:    94.1 mmHg MR Mean grad:    65.0 mmHg   SHUNTS MR Vmax:         485.00 cm/s Systemic VTI:  0.22 m MR Vmean:        382.0 cm/s  Systemic Diam: 1.70 cm MR PISA:         0.57 cm MR PISA Eff ROA: 5 mm MR PISA Radius:  0.30 cm MV E velocity: 42.80 cm/s MV A velocity: 66.40 cm/s MV E/A ratio:  0.64 Armanda Magicraci Turner MD Electronically signed by Armanda Magicraci Turner MD Signature Date/Time: 09/17/2019/1:02:56 PM    Final    CT HEAD CODE STROKE WO CONTRAST  Result Date: 09/19/2019 CLINICAL DATA:  Code stroke. Ataxia. Left facial droop. Rule out stroke. EXAM: CT HEAD WITHOUT CONTRAST TECHNIQUE: Contiguous axial images were obtained from the base of the skull through the vertex without intravenous contrast. COMPARISON:  CT head 09/16/2019 FINDINGS: Brain: Generalized atrophy. Negative for hydrocephalus. Patchy white  matter hypodensity diffusely is unchanged most compatible with chronic microvascular ischemia. Negative for acute infarct, hemorrhage, mass. Vascular: Negative for hyperdense vessel Skull: Negative Sinuses/Orbits: Mild mucosal edema paranasal sinuses. Bilateral cataract extraction. Other: None ASPECTS ([place] Stroke Program Early CT  Score) - Ganglionic level infarction (caudate, lentiform nuclei, internal capsule, insula, M1-M3 cortex): 7 - Supraganglionic infarction (M4-M6 cortex): 3 Total score (0-10 with 10 being normal): 10 IMPRESSION: 1. No acute intracranial abnormality 2. ASPECTS is 10 Electronically Signed   By: Marlan Palau M.D.   On: 09/19/2019 14:14   VAS US CAROTID (at [facility] and [facility] only)  Result Date: 09/18/2019 Carotid Arterial Duplex Study Indications:       Speech disturbance. Risk Factors:      Hypertension, hyperlipidemia, Diabetes. Other Factors:     Alzheimer's. Comparison Study:  No prior study on file for comparison Performing Technologist: Sherren Kerns RVS  Examination Guidelines: A complete evaluation includes B-mode imaging, spectral Doppler, color Doppler, and power Doppler as needed of all accessible portions of each vessel. Bilateral testing is considered an integral part of a complete examination. Limited examinations for reoccurring indications may be performed as noted.  Right Carotid Findings: +----------+--------+--------+--------+------------------+------------------+           PSV cm/sEDV cm/sStenosisPlaque DescriptionComments           +----------+--------+--------+--------+------------------+------------------+ CCA Prox  57      13                                                   +----------+--------+--------+--------+------------------+------------------+ CCA Distal87      12                                intimal thickening +----------+--------+--------+--------+------------------+------------------+ ICA Prox  61      16              heterogenous                         +----------+--------+--------+--------+------------------+------------------+ ICA Distal71      22                                                   +----------+--------+--------+--------+------------------+------------------+ ECA       62      16                                                    +----------+--------+--------+--------+------------------+------------------+ +----------+--------+-------+--------+-------------------+           PSV cm/sEDV cmsDescribeArm Pressure (mmHG) +----------+--------+-------+--------+-------------------+ Subclavian119                                        +----------+--------+-------+--------+-------------------+ +---------+--------+--+--------+--+ VertebralPSV cm/s78EDV cm/s10 +---------+--------+--+--------+--+  Left Carotid Findings: +----------+--------+--------+--------+------------------+--------+           PSV cm/sEDV cm/sStenosisPlaque DescriptionComments +----------+--------+--------+--------+------------------+--------+ CCA Prox  104     20                                         +----------+--------+--------+--------+------------------+--------+  CCA Distal70      14                                         +----------+--------+--------+--------+------------------+--------+ ICA Prox  60      16              heterogenous               +----------+--------+--------+--------+------------------+--------+ ICA Distal55      17                                         +----------+--------+--------+--------+------------------+--------+ ECA       24      1                                          +----------+--------+--------+--------+------------------+--------+ +----------+--------+--------+--------+-------------------+           PSV cm/sEDV cm/sDescribeArm Pressure (mmHG) +----------+--------+--------+--------+-------------------+ [id number]                                          +----------+--------+--------+--------+-------------------+ +---------+--------+--+--------+--+ VertebralPSV cm/s70EDV cm/s11 +---------+--------+--+--------+--+   Summary: Right Carotid: The extracranial vessels were near-normal with only minimal wall                thickening or plaque. Left Carotid: The  extracranial vessels were near-normal with only minimal wall               thickening or plaque. Vertebrals:  Bilateral vertebral arteries demonstrate antegrade flow. Subclavians: Normal flow hemodynamics were seen in bilateral subclavian              arteries. *See table(s) above for measurements and observations.  Electronically signed by Delia Heady MD on 09/18/2019 at 1:34:09 PM.    Final        Subjective: Patient seen and examined at the bedside this morning.  Currently hemodynamically stable.  Comfortable.  Alert and oriented.  Denies any new complaints.  Discharge Exam: Vitals:   09/20/19 0746 09/20/19 1209  BP: (!) 130/46 (!) 133/56  Pulse: 62 (!) 59  Resp: 18 20  Temp: 98.5 F (36.9 C) 98.1 F (36.7 C)  SpO2: 100% 100%   Vitals:   09/20/19 0318 09/20/19 0321 09/20/19 0746 09/20/19 1209  BP: (!) 130/114 (!) 130/5 (!) 130/46 (!) 133/56  Pulse: 65  62 (!) 59  Resp: Temp: 98.4 F (36.9 C)  98.5 F (36.9 C) 98.1 F (36.7 C)  TempSrc: Oral  Oral Oral  SpO2: 100%  100% 100%  Weight:      Height:        General: Pt is alert, awake, not in acute distress Cardiovascular: RRR, S1/S2 +, no rubs, no gallops Respiratory: CTA bilaterally, no wheezing, no rhonchi Abdominal: Soft, NT, ND, bowel sounds + Extremities: no edema, no cyanosis    The results of significant diagnostics from this hospitalization (including imaging, microbiology, ancillary and laboratory) are listed below for reference.     Microbiology: Recent Results (from the past 240 hour(s))  SARS Coronavirus 2 by RT PCR (hospital  order, performed in [facility] hospital lab) Nasopharyngeal Nasopharyngeal Swab     Status: None   Collection Time: 09/16/19  3:21 PM   Specimen: Nasopharyngeal Swab  Result Value Ref Range Status   SARS Coronavirus 2 NEGATIVE NEGATIVE Final    Comment: (NOTE) SARS-CoV-2 target nucleic acids are NOT DETECTED.  The SARS-CoV-2 RNA is generally detectable in upper and  lower respiratory specimens during the acute phase of infection. The lowest concentration of SARS-CoV-2 viral copies this assay can detect is 250 copies / mL. A negative result does not preclude SARS-CoV-2 infection and should not be used as the sole basis for treatment or other patient management decisions.  A negative result may occur with improper specimen collection / handling, submission of specimen other than nasopharyngeal swab, presence of viral mutation(s) within the areas targeted by this assay, and inadequate number of viral copies (<250 copies / mL). A negative result must be combined with clinical observations, patient history, and epidemiological information.  Fact Sheet for Patients:   BoilerBrush.com.cy  Fact Sheet for Healthcare Providers: https://pope.com/  This test is not yet approved or  cleared by the [place] FDA and has been authorized for detection and/or diagnosis of SARS-CoV-2 by FDA under an Emergency Use Authorization (EUA).  This EUA will remain in effect (meaning this test can be used) for the duration of the COVID-19 declaration under Section 564(b)(1) of the Act, 21 U.S.C. section 360bbb-3(b)(1), unless the authorization is terminated or revoked sooner.  Performed at [facility], [address] [place], [place] [address]   Culture, Urine     Status: Abnormal   Collection Time: 09/16/19  9:30 PM   Specimen: Urine, Random  Result Value Ref Range Status   Specimen Description URINE, RANDOM  Final   Special Requests   Final    NONE Performed at [facility], [address] [place], [place] [address]    Culture (A)  Final    20,000 COLONIES/mL MULTIPLE SPECIES PRESENT, SUGGEST RECOLLECTION   Report Status 09/18/2019 FINAL  Final  SARS Coronavirus 2 by RT PCR (hospital order, performed in [facility] hospital lab) Nasopharyngeal Nasopharyngeal Swab     Status: None   Collection  Time: 09/19/19  1:59 PM   Specimen: Nasopharyngeal Swab  Result Value Ref Range Status   SARS Coronavirus 2 NEGATIVE NEGATIVE Final    Comment: (NOTE) SARS-CoV-2 target nucleic acids are NOT DETECTED.  The SARS-CoV-2 RNA is generally detectable in upper and lower respiratory specimens during the acute phase of infection. The lowest concentration of SARS-CoV-2 viral copies this assay can detect is 250 copies / mL. A negative result does not preclude SARS-CoV-2 infection and should not be used as the sole basis for treatment or other patient management decisions.  A negative result may occur with improper specimen collection / handling, submission of specimen other than nasopharyngeal swab, presence of viral mutation(s) within the areas targeted by this assay, and inadequate number of viral copies (<250 copies / mL). A negative result must be combined with clinical observations, patient history, and epidemiological information.  Fact Sheet for Patients:   BoilerBrush.com.cy  Fact Sheet for Healthcare Providers: https://pope.com/  This test is not yet approved or  cleared by the [place] FDA and has been authorized for detection and/or diagnosis of SARS-CoV-2 by FDA under an Emergency Use Authorization (EUA).  This EUA will remain in effect (meaning this test can be used) for the duration of the COVID-19  declaration under Section 564(b)(1) of the Act, 21 U.S.C. section 360bbb-3(b)(1), unless the authorization is terminated or revoked sooner.  Performed at [facility], [address], [place], [place] [address]      Labs: BNP (last 3 results) Recent Labs    09/18/19 0234 09/19/19 0640  BNP 428.2* 352.3*   Basic Metabolic Panel: Recent Labs  Lab 09/17/19 0233 09/17/19 0233 09/18/19 0234 09/19/19 0640 09/19/19 1357 09/19/19 1402 09/20/19 0403  NA 135   < > 132* 133* 134* 136 135  K 3.9   < > 3.7 4.0 3.5 3.5  4.4  CL 102   < > 103 104 105 101 104  CO2 24  --  20* 21* 16*  --  21*  GLUCOSE 107*   < > 88 78 191* 191* 87  BUN 16   < > 14 13 15 14 14   CREATININE 1.30*   < > 1.20* 1.10* 1.34* 1.20* 1.19*  CALCIUM 9.4  --  8.9 9.1 9.3  --  9.3  MG 1.6*  --  1.6* 1.9  --   --   --    < > = values in this interval not displayed.   Liver Function Tests: Recent Labs  Lab 09/16/19 1407 09/18/19 0234 09/19/19 0640 09/19/19 1357  AST 20 20 26 31   ALT 10 10 9 11   ALKPHOS 62 58 57 58  BILITOT 0.7 0.6 0.6 0.7  PROT 7.1 6.1* 6.0* 6.2*  ALBUMIN 3.0* 2.3* 2.4* 2.5*   No results for input(s): LIPASE, AMYLASE in the last 168 hours. No results for input(s): AMMONIA in the last 168 hours. CBC: Recent Labs  Lab 09/16/19 1407 09/16/19 1407 09/17/19 0233 09/17/19 0233 09/18/19 0234 09/19/19 0640 09/19/19 1357 09/19/19 1402 09/20/19 0403  WBC 10.5   < > 9.6  --  11.3* 13.8* 15.6*  --  15.7*  NEUTROABS 5.8  --   --   --  6.2 10.4* 12.0*  --   --   HGB 9.1*   < > 10.1*   < > 10.0* 9.6* 10.1* 10.9* 10.3*  HCT 28.1*   < > 31.2*   < > 30.6* 29.1* 30.6* 32.0* 31.2*  MCV 88.1   < > 86.2  --  86.0 85.6 86.4  --  86.2  PLT 224   < > 242  --  220 232 205  --  227   < > = values in this interval not displayed.   Cardiac Enzymes: No results for input(s): CKTOTAL, CKMB, CKMBINDEX, TROPONINI in the last 168 hours. BNP: Invalid input(s): POCBNP CBG: Recent Labs  Lab 09/16/19 1316 09/19/19 1349 09/19/19 2245 09/20/19 0603  GLUCAP 90 194* 100* 84   D-Dimer No results for input(s): DDIMER in the last 72 hours. Hgb A1c No results for input(s): HGBA1C in the last 72 hours. Lipid Profile No results for input(s): CHOL, HDL, LDLCALC, TRIG, CHOLHDL, LDLDIRECT in the last 72 hours. Thyroid function studies No results for input(s): TSH, T4TOTAL, T3FREE, THYROIDAB in the last 72 hours.  Invalid input(s): FREET3 Anemia work up No results for input(s): VITAMINB12, FOLATE, FERRITIN, TIBC, IRON, RETICCTPCT in  the last 72 hours. Urinalysis    Component Value Date/Time   COLORURINE YELLOW 09/19/2019 2156   APPEARANCEUR CLEAR 09/19/2019 2156   LABSPEC 1.013 09/19/2019 2156   PHURINE 5.0 09/19/2019 2156   GLUCOSEU NEGATIVE 09/19/2019 2156   HGBUR NEGATIVE 09/19/2019 2156   BILIRUBINUR NEGATIVE 09/19/2019 2156   KETONESUR 5 (A)  09/19/2019 2156   PROTEINUR NEGATIVE 09/19/2019 2156   UROBILINOGEN 0.2 10/16/2014 1600   NITRITE NEGATIVE 09/19/2019 2156   LEUKOCYTESUR NEGATIVE 09/19/2019 2156   Sepsis Labs Invalid input(s): PROCALCITONIN,  WBC,  LACTICIDVEN Microbiology Recent Results (from the past 240 hour(s))  SARS Coronavirus 2 by RT PCR (hospital order, performed in [facility] hospital lab) Nasopharyngeal Nasopharyngeal Swab     Status: None   Collection Time: 09/16/19  3:21 PM   Specimen: Nasopharyngeal Swab  Result Value Ref Range Status   SARS Coronavirus 2 NEGATIVE NEGATIVE Final    Comment: (NOTE) SARS-CoV-2 target nucleic acids are NOT DETECTED.  The SARS-CoV-2 RNA is generally detectable in upper and lower respiratory specimens during the acute phase of infection. The lowest concentration of SARS-CoV-2 viral copies this assay can detect is 250 copies / mL. A negative result does not preclude SARS-CoV-2 infection and should not be used as the sole basis for treatment or other patient management decisions.  A negative result may occur with improper specimen collection / handling, submission of specimen other than nasopharyngeal swab, presence of viral mutation(s) within the areas targeted by this assay, and inadequate number of viral copies (<250 copies / mL). A negative result must be combined with clinical observations, patient history, and epidemiological information.  Fact Sheet for Patients:   BoilerBrush.com.cy  Fact Sheet for Healthcare Providers: https://pope.com/  This test is not yet approved or  cleared by the [place] FDA and has been authorized for detection and/or diagnosis of SARS-CoV-2 by FDA under an Emergency Use Authorization (EUA).  This EUA will remain in effect (meaning this test can be used) for the duration of the COVID-19 declaration under Section 564(b)(1) of the Act, 21 U.S.C. section 360bbb-3(b)(1), unless the authorization is terminated or revoked sooner.  Performed at [facility], [address] [place], [place] [address]   Culture, Urine     Status: Abnormal   Collection Time: 09/16/19  9:30 PM   Specimen: Urine, Random  Result Value Ref Range Status   Specimen Description URINE, RANDOM  Final   Special Requests   Final    NONE Performed at [facility], [address], [place], [place] [address]    Culture (A)  Final    20,000 COLONIES/mL MULTIPLE SPECIES PRESENT, SUGGEST RECOLLECTION   Report Status 09/18/2019 FINAL  Final  SARS Coronavirus 2 by RT PCR (hospital order, performed in [facility] hospital lab) Nasopharyngeal Nasopharyngeal Swab     Status: None   Collection Time: 09/19/19  1:59 PM   Specimen: Nasopharyngeal Swab  Result Value Ref Range Status   SARS Coronavirus 2 NEGATIVE NEGATIVE Final    Comment: (NOTE) SARS-CoV-2 target nucleic acids are NOT DETECTED.  The SARS-CoV-2 RNA is generally detectable in upper and lower respiratory specimens during the acute phase of infection. The lowest concentration of SARS-CoV-2 viral copies this assay can detect is 250 copies / mL. A negative result does not preclude SARS-CoV-2 infection and should not be used as the sole basis for treatment or other patient management decisions.  A negative result may occur with improper specimen collection / handling, submission of specimen other than nasopharyngeal swab, presence of viral mutation(s) within the areas targeted by this assay, and inadequate number of viral copies (<250 copies / mL). A negative result must be combined with clinical observations,  patient history, and epidemiological information.  Fact Sheet for Patients:   BoilerBrush.com.cy  Fact Sheet for Healthcare Providers:  https://pope.com/  This test is not yet approved or  cleared by the [place] and has been authorized for detection and/or diagnosis of SARS-CoV-2 by FDA under an Emergency Use Authorization (EUA).  This EUA will remain in effect (meaning this test can be used) for the duration of the COVID-19 declaration under Section 564(b)(1) of the Act, 21 U.S.C. section 360bbb-3(b)(1), unless the authorization is terminated or revoked sooner.  Performed at [facility], [address], [place], [place] [address]     Please note: You were cared for by a hospitalist during your hospital stay. Once you are discharged, your primary care physician will handle any further medical issues. Please note that NO REFILLS for any discharge medications will be authorized once you are discharged, as it is imperative that you return to your primary care physician (or establish a relationship with a primary care physician if you do not have one) for your post hospital discharge needs so that they can reassess your need for medications and monitor your lab values.    Time coordinating discharge: 40 minutes  SIGNED:   Burnadette Pop, MD  [facility] 09/20/2019, 1:42 PM Pager [phone]  If 7PM-7AM, please contact night-coverage [email or web address] Password [email or web address]

## 2019-09-20 NOTE — Evaluation (Signed)
Physical Therapy Evaluation Patient Details Name: Andrea Warner MRN: [id number] DOB: 04-28-1931 Today's Date: 09/20/2019   History of Present Illness  Pt is an 86 y/o female admitted secondary to episode of unresponsiveness; workup pending. MRI negative. Pt with recent admission for TIA. PMH includes dementia, HTN, and DM.   Clinical Impression  Pt admitted secondary to problem above with deficits below. Pt requiring min guard A for mobility within the room using RW. Pt reporting R hip pain which limited gait distance. Pt reports she plans to live with her son for awhile. Recommend HHPT at d/c to increase independence and safety with functional mobility. Will continue to follow acutely.    Follow Up Recommendations Home health PT;Supervision/Assistance - 24 hour    Equipment Recommendations  None recommended by PT    Recommendations for Other Services       Precautions / Restrictions Precautions Precautions: Fall Restrictions Weight Bearing Restrictions: No      Mobility  Bed Mobility Overal bed mobility: Needs Assistance Bed Mobility: Supine to Sit     Supine to sit: Min assist     General bed mobility comments: Min A for trunk assist.   Transfers Overall transfer level: Needs assistance Equipment used: Rolling walker (2 wheeled) Transfers: Sit to/from Stand Sit to Stand: Min guard         General transfer comment: Min guard for safety to come to standing. Cues for safe hand placement.   Ambulation/Gait Ambulation/Gait assistance: Min guard Gait Distance (Feet): 15 Feet Assistive device: Rolling walker (2 wheeled) Gait Pattern/deviations: Step-through pattern;Decreased stride length;Trunk flexed Gait velocity: Decreased   General Gait Details: Cues for proximity to device. Pt with mild antalgic gait pattern secondary to R hip pain. Distance limited to chair secondary to pain.   Stairs            Wheelchair Mobility    Modified Rankin (Stroke Patients  Only)       Balance Overall balance assessment: Needs assistance Sitting-balance support: No upper extremity supported;Feet supported Sitting balance-Leahy Scale: Fair     Standing balance support: During functional activity;Bilateral upper extremity supported Standing balance-Leahy Scale: Poor Standing balance comment: Reliant on BUE support                              Pertinent Vitals/Pain Pain Assessment: Faces Faces Pain Scale: Hurts a little bit Pain Location: R hip  Pain Descriptors / Indicators: Sore Pain Intervention(s): Monitored during session;Limited activity within patient's tolerance;Repositioned    Home Living Family/patient expects to be discharged to:: Private residence Living Arrangements: Children Available Help at Discharge: Family Type of Home: House Home Access: Level entry (reports she will be living in his basement)     Home Layout: Two level;Able to live on main level with bedroom/bathroom Home Equipment: Gilmer Mor - single point;Walker - 2 wheels Additional Comments: Reports she plans to live with her son.     Prior Function Level of Independence: Needs assistance   Gait / Transfers Assistance Needed: Uses RW for ambulation   ADL's / Homemaking Assistance Needed: Needed assist with bathing and dressing         Hand Dominance        Extremity/Trunk Assessment   Upper Extremity Assessment Upper Extremity Assessment: Generalized weakness    Lower Extremity Assessment Lower Extremity Assessment: RLE deficits/detail;Generalized weakness RLE Deficits / Details: Reports R hip pain that started on 6/22.     Cervical /  Trunk Assessment Cervical / Trunk Assessment: Kyphotic  Communication   Communication: No difficulties  Cognition Arousal/Alertness: Awake/alert Behavior During Therapy: WFL for tasks assessed/performed Overall Cognitive Status: History of cognitive impairments - at baseline                                  General Comments: Pt with dementia at baseline.       General Comments General comments (skin integrity, edema, etc.): Pt's son present at end of session     Exercises     Assessment/Plan    PT Assessment Patient needs continued PT services  PT Problem List Decreased strength;Decreased balance;Decreased mobility;Decreased knowledge of use of DME;Decreased safety awareness;Pain;Decreased activity tolerance;Decreased cognition       PT Treatment Interventions DME instruction;Gait training;Functional mobility training;Therapeutic activities;Therapeutic exercise;Cognitive remediation;Patient/family education    PT Goals (Current goals can be found in the Care Plan section)  Acute Rehab PT Goals Patient Stated Goal: to decrease pain  PT Goal Formulation: With patient Time For Goal Achievement: 10/04/19 Potential to Achieve Goals: Good    Frequency Min 3X/week   Barriers to discharge        Co-evaluation               AM-PAC PT "6 Clicks" Mobility  Outcome Measure Help needed turning from your back to your side while in a flat bed without using bedrails?: None Help needed moving from lying on your back to sitting on the side of a flat bed without using bedrails?: A Little Help needed moving to and from a bed to a chair (including a wheelchair)?: A Little Help needed standing up from a chair using your arms (e.g., wheelchair or bedside chair)?: A Little Help needed to walk in hospital room?: A Little Help needed climbing 3-5 steps with a railing? : A Lot 6 Click Score: 18    End of Session Equipment Utilized During Treatment: Gait belt Activity Tolerance: Patient limited by pain Patient left: in chair;with call bell/phone within reach;with chair alarm set;with family/visitor present Nurse Communication: Mobility status;Patient requests pain meds (Pt's son brought medicine pharmacy requested) PT Visit Diagnosis: Difficulty in walking, not elsewhere classified  (R26.2);Muscle weakness (generalized) (M62.81);Pain Pain - Right/Left: Right Pain - part of body: Hip    Time: 1157-2620 PT Time Calculation (min) (ACUTE ONLY): 25 min   Charges:   PT Evaluation $PT Eval Moderate Complexity: 1 Mod PT Treatments $Therapeutic Activity: 8-22 mins        Lou Miner, DPT  Acute Rehabilitation Services  Pager: [phone] Office: [phone]   Rudean Hitt 09/20/2019, 11:45 AM

## 2019-09-20 NOTE — Progress Notes (Signed)
Discharged to home after IV access removed and instructions given to pt and son.  All questions answered.

## 2019-09-20 NOTE — Progress Notes (Signed)
Reason for consult: Code stroke   Subjective: Patient is back to her baseline today.  Does not recollect the event completely.   ROS: Negative  Examination  Vital signs in last 24 hours: Temp:  [97.9 F (36.6 C)-98.5 F (36.9 C)] 98.1 F (36.7 C) (06/23 1209) Pulse Rate:  [59-67] 59 (06/23 1209) Resp:  [11-20] 20 (06/23 1209) BP: (117-142)/(5-114) 133/56 (06/23 1209) SpO2:  [99 %-100 %] 100 % (06/23 1209) Weight:  [53.4 kg] 53.4 kg (06/23 0008)  General: lying in bed CVS: pulse-normal rate and rhythm RS: breathing comfortably Extremities: normal   Neuro: MS: Alert, oriented, follows commands CN: pupils equal and reactive,  EOMI, face symmetric, tongue midline, normal sensation over face, Motor: 5/5 strength in all 4 extremities plantars: flexor Coordination: normal Gait: not tested  Basic Metabolic Panel: Recent Labs  Lab 09/17/19 0233 09/17/19 0233 09/18/19 0234 09/18/19 0234 09/19/19 0640 09/19/19 1357 09/19/19 1402 09/20/19 0403  NA 135   < > 132*  --  133* 134* 136 135  K 3.9   < > 3.7  --  4.0 3.5 3.5 4.4  CL 102   < > 103  --  104 105 101 104  CO2 24  --  20*  --  21* 16*  --  21*  GLUCOSE 107*   < > 88  --  78 191* 191* 87  BUN 16   < > 14  --  13 15 14 14   CREATININE 1.30*   < > 1.20*  --  1.10* 1.34* 1.20* 1.19*  CALCIUM 9.4   < > 8.9   < > 9.1 9.3  --  9.3  MG 1.6*  --  1.6*  --  1.9  --   --   --    < > = values in this interval not displayed.    CBC: Recent Labs  Lab 09/16/19 1407 09/16/19 1407 09/17/19 0233 09/17/19 0233 09/18/19 0234 09/19/19 0640 09/19/19 1357 09/19/19 1402 09/20/19 0403  WBC 10.5   < > 9.6  --  11.3* 13.8* 15.6*  --  15.7*  NEUTROABS 5.8  --   --   --  6.2 10.4* 12.0*  --   --   HGB 9.1*   < > 10.1*   < > 10.0* 9.6* 10.1* 10.9* 10.3*  HCT 28.1*   < > 31.2*   < > 30.6* 29.1* 30.6* 32.0* 31.2*  MCV 88.1   < > 86.2  --  86.0 85.6 86.4  --  86.2  PLT 224   < > 242  --  220 232 205  --  227   < > = values in this  interval not displayed.     Coagulation Studies: Recent Labs    09/19/19 1357  LABPROT 14.7  INR 1.2    Imaging Reviewed: MRI brain negative for acute stroke and MRA also negative for intracranial stenosis    ASSESSMENT AND PLAN  86 y.o.femalewith past medical history significant for Alzheimer's disease, depression, hypertension presents the emergency department after just being released from the hospital after presenting with TIA/stroke symptoms presents to the ED after having episode of unresponsiveness while climbing the stairs.  Patient noted to be hypotensive by EMS.  EMS told she had right facial droop and slurred speech therefore code stroke initiated.  -Infectious and metabolic work-up to evaluate for hypotension negative so far -Hypotension possibly related to medications.MRI brain and MRA completed: Negative for acute infarct negative for intracranial stenosis.  Carotid Doppler performed day before yesterday showed no significant stenosis.   Syncope/presyncope Acute metabolic encephalopathy  Recommendations -Continue aspirin 81 mg -Continue Mevacor 20 mg daily  Outpatient stroke clinic follow-up.  Neurology will be available as needed   [place] Pager Number [phone] For questions after 7pm please refer to AMION to reach the Neurologist on call

## 2019-09-20 NOTE — Plan of Care (Signed)

## 2019-09-22 LAB — PATHOLOGIST SMEAR REVIEW

## 2019-09-27 NOTE — Progress Notes (Signed)
Cardiology Office Note:   Date:  09/28/2019  NAME:  Andrea Warner    MRN: [id number] DOB:  [birth year]   PCP:  Lester [place]., MD  Cardiologist:  No primary care provider on file.   Referring MD: No ref. provider found   Chief Complaint  Patient presents with   Congestive Heart Failure   History of Present Illness:   Andrea Warner is a 86 y.o. female with a hx of HTN, dementia, diabetes, HLD who is being seen today for the evaluation of systolic heart failure at the request of Lester [place]., MD. Recent admission for TIA. Found to have reduced EF 35-40% with WMA.   She presents with her son.  They report that the concern for going to the hospital with confusion.  She was diagnosed with a TIA.  She was found to have a low EF.  They report that she has had memory troubles for a few years.  She is recently moved in with her son.  Apparently there has been significant weight loss as well.  She has lost 26 pounds she is not eating well.  She does use a walker.  She is able to bathe and dress herself.  She require prompting to remember to eat.  Her son reports that before his father died, her husband, there possibly had been longer issues with memory troubles.  She reports no history of diabetes.  Her A1c is 6.6 now.  She reports a long history of hypertension.  She denies any chest pain or trouble breathing.  She reports no lower extremity edema.  She has no symptoms of congestive heart failure.  She was apparently placed on BiDil at discharge.  They cannot afford this.  They do request an alternative agent.  Her heart rate today is 49 bpm with low voltage.  There is no evidence of conduction disease.  Her troponins were mildly elevated in the hospital.  Her BNP was elevated as well.  She surprisingly continues to have no symptoms of chest pain.  I did have an extensive discussion with the son that she is either had a heart attack or has an infiltrative cardiomyopathy.  She does have  wall motion abnormalities in the basal septum and basal inferior wall.  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  She has low voltage on her EKG as well.  There is no evidence of conduction disease.  We did discuss given her advanced dementia that medical therapy may be the best option.  He was in agreement with this.  I also recommended aggressive goals of care discussion.  They are working on power of attorney. I think she is 1. Dementia 2. TIA -08/2019 3. Diabetes -A1c 6.6 -T chol 151, HDL 41, LDL 97, TG 63 4. HTN 5. Systolic HF, 35-40%  Past Medical History: Past Medical History:  Diagnosis Date   Alzheimer's dementia (HCC)    Coronary artery disease    Depression    GERD (gastroesophageal reflux disease)    Hypertension    Thyroid disease     Past Surgical History: Past Surgical History:  Procedure Laterality Date   ABDOMINAL HYSTERECTOMY     FRACTURE SURGERY      Current Medications: Current Meds  Medication Sig   aspirin EC 81 MG EC tablet Take 1 tablet (81 mg total) by mouth daily. Swallow whole.   lovastatin (MEVACOR) 20 MG tablet Take 20 mg by mouth daily.    vitamin B-12 1000 MCG tablet Take  1 tablet (1,000 mcg total) by mouth daily.     Allergies:    Amlodipine, Codeine, Cyclobenzaprine, Iodinated diagnostic agents, Iodine, Shellfish-derived products, Actonel [risedronate sodium], Valsartan, Atorvastatin, Losartan potassium-hctz, Metoprolol, Penicillins, Pravastatin, Risedronate, Singulair [montelukast sodium], Sulfa antibiotics, and Zafirlukast   Social History: Social History   Socioeconomic History   Marital status: Married    Spouse name: Not on file   Number of children: Not on file   Years of education: Not on file   Highest education level: Not on file  Occupational History   Occupation: retired  Tobacco Use   Smoking status: Never Smoker   Smokeless tobacco: Never Used  Substance and Sexual Activity   Alcohol use: No    Drug use: Not on file   Sexual activity: Not on file  Other Topics Concern   Not on file  Social History Narrative   Not on file   Social Determinants of Health   Financial Resource Strain:    Difficulty of Paying Living Expenses:   Food Insecurity:    Worried About Programme researcher, broadcasting/film/videounning Out of Food in the Last Year:    Baristaan Out of Food in the Last Year:   Transportation Needs:    Freight forwarderLack of Transportation (Medical):    Lack of Transportation (Non-Medical):   Physical Activity:    Days of Exercise per Week:    Minutes of Exercise per Session:   Stress:    Feeling of Stress :   Social Connections:    Frequency of Communication with Friends and Family:    Frequency of Social Gatherings with Friends and Family:    Attends Religious Services:    Active Member of Clubs or Organizations:    Attends BankerClub or Organization Meetings:    Marital Status:      Family History: The patient's family history includes Hypertension in her father.  ROS:   All other ROS reviewed and negative. Pertinent positives noted in the HPI.     EKGs/Labs/Other Studies Reviewed:   The following studies were personally reviewed by me today:  EKG:  EKG is ordered today.  The ekg ordered today demonstrates sinus bradycardia, heart rate 49, septal infarct, low voltage noted, and was personally reviewed by me.   TTE 09/17/2019 1. Left ventricular ejection fraction, by estimation, is 35 to 40%. The  left ventricle has moderately decreased function. The left ventricle has  no regional wall motion abnormalities. Left ventricular diastolic  parameters are consistent with Grade I  diastolic dysfunction (impaired relaxation). There is akinesis of the left  ventricular, basal-mid anteroseptal wall, inferoseptal wall and inferior  wall.  2. Right ventricular systolic function is normal. The right ventricular  size is normal. There is normal pulmonary artery systolic pressure.  3. The mitral valve is normal  in structure. Mild mitral valve  regurgitation. No evidence of mitral stenosis.  4. The aortic valve is tricuspid. Aortic valve regurgitation is mild.  Mild to moderate aortic valve sclerosis/calcification is present, without  any evidence of aortic stenosis. Aortic regurgitation PHT measures 654  msec.  5. The inferior vena cava is normal in size with greater than 50%  respiratory variability, suggesting right atrial pressure of 3 mmHg.   Recent Labs: 09/17/2019: TSH 0.086 09/19/2019: ALT 11; B Natriuretic Peptide 352.3; Magnesium 1.9 09/20/2019: BUN 14; Creatinine, Ser 1.19; Hemoglobin 10.3; Platelets 227; Potassium 4.4; Sodium 135   Recent Lipid Panel    Component Value Date/Time   CHOL 151 09/17/2019 0233   TRIG 63 09/17/2019  0233   HDL 41 09/17/2019 0233   CHOLHDL 3.7 09/17/2019 0233   VLDL 13 09/17/2019 0233   LDLCALC 97 09/17/2019 0233    Physical Exam:   VS:  BP (!) 118/54    Pulse (!) 49    Ht 5\' 4"  (1.626 m)    Wt 115 lb 3.2 oz (52.3 kg)    BMI 19.77 kg/m    Wt Readings from Last 3 Encounters:  09/28/19 115 lb 3.2 oz (52.3 kg)  09/20/19 117 lb 11.6 oz (53.4 kg)  09/19/19 115 lb 11.9 oz (52.5 kg)    General: Well nourished, well developed, in no acute distress Heart: Atraumatic, normal size  Eyes: PEERLA, EOMI  Neck: Supple, no JVD Endocrine: No thryomegaly Cardiac: Normal S1, S2; RRR; no murmurs, rubs, or gallops Lungs: Clear to auscultation bilaterally, no wheezing, rhonchi or rales  Abd: Soft, nontender, no hepatomegaly  Ext: No edema, pulses 2+ Musculoskeletal: No deformities, BUE and BLE strength normal and equal Skin: Warm and dry, no rashes   Neuro: Alert and oriented to person, place, time, and situation, CNII-XII grossly intact, no focal deficits  Psych: Normal mood and affect   ASSESSMENT:   Andrea Warner is a 86 y.o. female who presents for the following: 1. Chronic systolic heart failure (HCC)   2. Coronary artery disease involving native  coronary artery of native heart without angina pectoris   3. Essential hypertension   4. Mixed hyperlipidemia     PLAN:   1. Chronic systolic heart failure (HCC) -She is newly diagnosed systolic heart failure ejection fraction 35 to 40%.  She has bizarre wall motion abnormalities.  Is limited to the basal segments of the septum as well as the basal inferior wall.  It almost looks like cardiac sarcoidosis.  She had mildly elevated troponins in the hospital.  She did have an elevated BNP.  She has no symptoms of congestive heart failure.  I did discuss pursuing cardiac catheterization to exclude CAD however given her advanced dementia I think a stress test would be reasonable.  She has no symptoms and we should probably focus on comfort.  I did have an extensive discussion with the son about her diagnosis and about aggressive work-ups.  He is agreed that less invasive work-ups would likely be in her best interest.  We should get a good idea what her stress test looks like if she has fixed basal defects this may represent indeed cardiac sarcoidosis.  We will see what the stress test shows. -Resting heart rate is 49.  She will not tolerate a beta-blocker.  We will hold that for now.  She was on BiDil.  She has allergies to ACE inhibitors and ARB use.  I do not think 83 will help.  Her blood pressure is 118/54.  I recommended we start hydralazine 25 mg 3 times daily and Imdur 15 mg daily. -She does need to eat.  We recommend a low-salt diet.  We will see her back relatively quickly after her stress test to make sure she is doing well.  2. Coronary artery disease involving native coronary artery of native heart without angina pectoris -Presumptively she has CAD.  The wall motion abnormalities are little bizarre.  This could represent cardiac sarcoidosis is stated above.  She will continue aspirin for now.  She has numerous statin allergies.  Given her advanced dementia we will hold on statin agent.  3.  Essential hypertension -Imdur and hydralazine.  4. Mixed hyperlipidemia -Most  recent LDL cholesterol 97.  Statin intolerant.  We will discuss Zetia or other agents at her next visit.   Disposition: Return in about 6 weeks (around 11/09/2019).  Medication Adjustments/Labs and Tests Ordered: Current medicines are reviewed at length with the patient today.  Concerns regarding medicines are outlined above.  Orders Placed This Encounter  Procedures   MYOCARDIAL PERFUSION IMAGING   EKG 12-Lead   Meds ordered this encounter  Medications   DISCONTD: hydrALAZINE (APRESOLINE) 25 MG tablet    Sig: Take 1 tablet (25 mg total) by mouth 3 (three) times daily.    Dispense:  270 tablet    Refill:  3   DISCONTD: isosorbide mononitrate (IMDUR) 30 MG 24 hr tablet    Sig: Take 0.5 tablets (15 mg total) by mouth daily.    Dispense:  90 tablet    Refill:  1   hydrALAZINE (APRESOLINE) 25 MG tablet    Sig: Take 1 tablet (25 mg total) by mouth 3 (three) times daily.    Dispense:  270 tablet    Refill:  3   isosorbide mononitrate (IMDUR) 30 MG 24 hr tablet    Sig: Take 0.5 tablets (15 mg total) by mouth daily.    Dispense:  90 tablet    Refill:  1    Patient Instructions  Medication Instructions:  Stop Bidil Start Hydralazine 25 mg three times daily   Start Imdur 15 mg daily   *If you need a refill on your cardiac medications before your next appointment, please call your pharmacy*  Testing/Procedures:  Your physician has requested that you have a lexiscan myoview (at [address] per Dr.O'Neal). A cardiac stress test is a cardiological test that measures the heart's ability to respond to external stress in a controlled clinical environment. The stress response is induced by intravenous pharmacological stimulation.    Follow-Up: At [facility], you and your health needs are our priority.  As part of our continuing mission to provide you with exceptional heart care, we have created  designated Provider Care Teams.  These Care Teams include your primary Cardiologist (physician) and Advanced Practice Providers (APPs -  Physician Assistants and Nurse Practitioners) who all work together to provide you with the care you need, when you need it.  We recommend signing up for the patient portal called "MyChart".  Sign up information is provided on this After Visit Summary.  MyChart is used to connect with patients for Virtual Visits (Telemedicine).  Patients are able to view lab/test results, encounter notes, upcoming appointments, etc.  Non-urgent messages can be sent to your provider as well.   To learn more about what you can do with MyChart, go to [email or web address].    Your next appointment:   6 weeks  The format for your next appointment:   In Person  Provider:   Lennie Odor, MD      Signed, Lenna Gilford. Flora Lipps, MD [facility]  [address], [address] [place], [place] [address] [phone]  09/28/2019 4:28 PM

## 2019-09-28 ENCOUNTER — Encounter: Payer: Self-pay | Admitting: Cardiovascular Disease

## 2019-09-28 ENCOUNTER — Ambulatory Visit: Payer: Medicare HMO | Admitting: Cardiovascular Disease

## 2019-09-28 ENCOUNTER — Other Ambulatory Visit: Payer: Self-pay

## 2019-09-28 VITALS — BP 118/54 | HR 49 | Ht 64.0 in | Wt 115.2 lb

## 2019-09-28 DIAGNOSIS — I5022 Chronic systolic (congestive) heart failure: Secondary | ICD-10-CM | POA: Diagnosis not present

## 2019-09-28 DIAGNOSIS — E782 Mixed hyperlipidemia: Secondary | ICD-10-CM

## 2019-09-28 DIAGNOSIS — I2510 Atherosclerotic heart disease of native coronary artery without angina pectoris: Secondary | ICD-10-CM

## 2019-09-28 DIAGNOSIS — I10 Essential (primary) hypertension: Secondary | ICD-10-CM

## 2019-09-28 MED ORDER — ISOSORBIDE MONONITRATE ER 30 MG PO TB24
15.0000 mg | ORAL_TABLET | Freq: Every day | ORAL | 1 refills | Status: DC
Start: 2019-09-28 — End: 2019-09-28

## 2019-09-28 MED ORDER — ISOSORBIDE MONONITRATE ER 30 MG PO TB24
15.0000 mg | ORAL_TABLET | Freq: Every day | ORAL | 1 refills | Status: DC
Start: 2019-09-28 — End: 2020-09-16

## 2019-09-28 MED ORDER — HYDRALAZINE HCL 25 MG PO TABS
25.0000 mg | ORAL_TABLET | Freq: Three times a day (TID) | ORAL | 3 refills | Status: DC
Start: 2019-09-28 — End: 2019-11-22

## 2019-09-28 MED ORDER — HYDRALAZINE HCL 25 MG PO TABS
25.0000 mg | ORAL_TABLET | Freq: Three times a day (TID) | ORAL | 3 refills | Status: DC
Start: 2019-09-28 — End: 2019-09-28

## 2019-09-28 NOTE — Patient Instructions (Addendum)
Medication Instructions:  Stop Bidil Start Hydralazine 25 mg three times daily   Start Imdur 15 mg daily   *If you need a refill on your cardiac medications before your next appointment, please call your pharmacy*  Testing/Procedures:  Your physician has requested that you have a lexiscan myoview (at [address] per Dr.O'Neal). A cardiac stress test is a cardiological test that measures the heart's ability to respond to external stress in a controlled clinical environment. The stress response is induced by intravenous pharmacological stimulation.    Follow-Up: At [facility], you and your health needs are our priority.  As part of our continuing mission to provide you with exceptional heart care, we have created designated Provider Care Teams.  These Care Teams include your primary Cardiologist (physician) and Advanced Practice Providers (APPs -  Physician Assistants and Nurse Practitioners) who all work together to provide you with the care you need, when you need it.  We recommend signing up for the patient portal called "MyChart".  Sign up information is provided on this After Visit Summary.  MyChart is used to connect with patients for Virtual Visits (Telemedicine).  Patients are able to view lab/test results, encounter notes, upcoming appointments, etc.  Non-urgent messages can be sent to your provider as well.   To learn more about what you can do with MyChart, go to [email or web address].    Your next appointment:   6 weeks  The format for your next appointment:   In Person  Provider:   Lennie Odor, MD

## 2019-10-05 ENCOUNTER — Encounter: Payer: Self-pay | Admitting: *Deleted

## 2019-10-05 ENCOUNTER — Telehealth (HOSPITAL_COMMUNITY): Payer: Self-pay | Admitting: *Deleted

## 2019-10-05 NOTE — Telephone Encounter (Signed)
Left message on voicemail per DPR in reference to upcoming appointment scheduled on 10/11/19 with detailed instructions given per Myocardial Perfusion Study Information Sheet for the test. LM to arrive 15 minutes early, and that it is imperative to arrive on time for appointment to keep from having the test rescheduled. If you need to cancel or reschedule your appointment, please call the office within 24 hours of your appointment. Failure to do so may result in a cancellation of your appointment, and a $50 no show fee. Phone number given for call back for any questions. Trampe, Roxene Alviar Jacqueline    

## 2019-10-05 NOTE — Progress Notes (Signed)
Patient ID: Andrea Warner, female   DOB: [birth year], 87 y.o.   MRN: [id number] Patient enrolled for [employer] to ship a 30 day Cardiac event monitor to her home. Letter with instructions mailed to patient.

## 2019-10-11 ENCOUNTER — Ambulatory Visit (HOSPITAL_COMMUNITY): Payer: Medicare HMO | Attending: Cardiovascular Disease

## 2019-10-11 ENCOUNTER — Other Ambulatory Visit: Payer: Self-pay

## 2019-10-11 DIAGNOSIS — I5022 Chronic systolic (congestive) heart failure: Secondary | ICD-10-CM | POA: Insufficient documentation

## 2019-10-11 LAB — MYOCARDIAL PERFUSION IMAGING
LV dias vol: 85 mL (ref 46–106)
LV sys vol: 30 mL
Peak HR: 75 {beats}/min
Rest HR: 62 {beats}/min
SRS: 0
SSS: 4
TID: 1.01

## 2019-10-11 MED ORDER — TECHNETIUM TC 99M TETROFOSMIN IV KIT
10.3000 | PACK | Freq: Once | INTRAVENOUS | Status: AC | PRN
  Administered 2019-10-11: 10.3 via INTRAVENOUS
  Filled 2019-10-11: qty 11

## 2019-10-11 MED ORDER — REGADENOSON 0.4 MG/5ML IV SOLN
0.4000 mg | Freq: Once | INTRAVENOUS | Status: AC
Start: 2019-10-11 — End: 2019-10-11
  Administered 2019-10-11: 0.4 mg via INTRAVENOUS

## 2019-10-11 MED ORDER — TECHNETIUM TC 99M TETROFOSMIN IV KIT
32.4000 | PACK | Freq: Once | INTRAVENOUS | Status: AC | PRN
  Administered 2019-10-11: 32.4 via INTRAVENOUS
  Filled 2019-10-11: qty 33

## 2019-10-12 ENCOUNTER — Ambulatory Visit (INDEPENDENT_AMBULATORY_CARE_PROVIDER_SITE_OTHER): Payer: Medicare HMO

## 2019-10-12 DIAGNOSIS — I5021 Acute systolic (congestive) heart failure: Secondary | ICD-10-CM

## 2019-10-12 DIAGNOSIS — R001 Bradycardia, unspecified: Secondary | ICD-10-CM

## 2019-10-12 DIAGNOSIS — I6310 Cerebral infarction due to embolism of unspecified precerebral artery: Secondary | ICD-10-CM

## 2019-11-08 ENCOUNTER — Ambulatory Visit: Payer: Medicare HMO | Admitting: Cardiovascular Disease

## 2019-11-20 ENCOUNTER — Telehealth: Payer: Self-pay

## 2019-11-20 NOTE — Progress Notes (Signed)
Cardiology Office Note:   Date:  11/22/2019  NAME:  Andrea Warner    MRN: [id number] DOB:  [birth year]   PCP:  Lester [place]., MD  Cardiologist:  No primary care provider on file.   Referring MD: Lester [place]., MD   Chief Complaint  Patient presents with  . Follow-up    History of Present Illness:   Andrea Warner is a 86 y.o. female with a hx of systolic heart failure, TIA, diabetes, hypertension, dementia presents for follow-up.  She was admitted in June for TIA.  Found to have reduced ejection fraction as part of that work-up.  Bizarre wall motion normalities could represent possible reverse Takotsubo.  Underwent nuclear medicine stress with no ischemia and normal EF.   She presents with her son today.  She reports she is doing well.  She is actually moved back into her home with some assistance.  She does suffer from dementia and apparently appetite is an issue.  She is lost considerable weight per the son.  She is down to 117 pounds.  Apparently she was up to 130-140 range.  She is without complaints today.  She denies any chest pain, shortness of breath or palpitations.  She is taking Lasix 20 mg as needed with improvement in lower extremity edema.  We did go over the results of her stress test which show her EF is normal and her perfusion imaging shows no evidence of significant blockages.  We did discuss that her EF drop may have been related to stress.  1. Dementia 2. TIA -08/2019 -Negative monitor for A. fib 3. Diabetes -A1c 6.6 -T chol 151, HDL 41, LDL 97, TG 63 4. HTN 5. Systolic HF, 35-40% -Normal nuclear medicine stress test  Past Medical History: Past Medical History:  Diagnosis Date  . Alzheimer's dementia (HCC)   . Coronary artery disease   . Depression   . GERD (gastroesophageal reflux disease)   . Hypertension   . Thyroid disease     Past Surgical History: Past Surgical History:  Procedure Laterality Date  . ABDOMINAL HYSTERECTOMY    .  FRACTURE SURGERY      Current Medications: Current Meds  Medication Sig  . aspirin EC 81 MG EC tablet Take 1 tablet (81 mg total) by mouth daily. Swallow whole.  . furosemide (LASIX) 20 MG tablet Take 20 mg by mouth daily as needed.  . isosorbide mononitrate (IMDUR) 30 MG 24 hr tablet Take 0.5 tablets (15 mg total) by mouth daily.  Marland Kitchen lovastatin (MEVACOR) 20 MG tablet Take 20 mg by mouth daily.   . vitamin B-12 1000 MCG tablet Take 1 tablet (1,000 mcg total) by mouth daily.     Allergies:    Amlodipine, Codeine, Cyclobenzaprine, Iodinated diagnostic agents, Iodine, Shellfish-derived products, Actonel [risedronate sodium], Valsartan, Atorvastatin, Losartan potassium-hctz, Metoprolol, Penicillins, Pravastatin, Risedronate, Singulair [montelukast sodium], Sulfa antibiotics, and Zafirlukast   Social History: Social History   Socioeconomic History  . Marital status: Married    Spouse name: Not on file  . Number of children: Not on file  . Years of education: Not on file  . Highest education level: Not on file  Occupational History  . Occupation: retired  Tobacco Use  . Smoking status: Never Smoker  . Smokeless tobacco: Never Used  Substance and Sexual Activity  . Alcohol use: No  . Drug use: Not on file  . Sexual activity: Not on file  Other Topics Concern  . Not on file  Social History Narrative  . Not on file   Social Determinants of Health   Financial Resource Strain:   . Difficulty of Paying Living Expenses: Not on file  Food Insecurity:   . Worried About Programme researcher, broadcasting/film/videounning Out of Food in the Last Year: Not on file  . Ran Out of Food in the Last Year: Not on file  Transportation Needs:   . Lack of Transportation (Medical): Not on file  . Lack of Transportation (Non-Medical): Not on file  Physical Activity:   . Days of Exercise per Week: Not on file  . Minutes of Exercise per Session: Not on file  Stress:   . Feeling of Stress : Not on file  Social Connections:   . Frequency  of Communication with Friends and Family: Not on file  . Frequency of Social Gatherings with Friends and Family: Not on file  . Attends Religious Services: Not on file  . Active Member of Clubs or Organizations: Not on file  . Attends BankerClub or Organization Meetings: Not on file  . Marital Status: Not on file     Family History: The patient's family history includes Hypertension in her father.  ROS:   All other ROS reviewed and negative. Pertinent positives noted in the HPI.     EKGs/Labs/Other Studies Reviewed:   The following studies were personally reviewed by me today:   TTE 09/17/2019 1. Left ventricular ejection fraction, by estimation, is 35 to 40%. The  left ventricle has moderately decreased function. The left ventricle has  no regional wall motion abnormalities. Left ventricular diastolic  parameters are consistent with Grade I  diastolic dysfunction (impaired relaxation). There is akinesis of the left  ventricular, basal-mid anteroseptal wall, inferoseptal wall and inferior  wall.  2. Right ventricular systolic function is normal. The right ventricular  size is normal. There is normal pulmonary artery systolic pressure.  3. The mitral valve is normal in structure. Mild mitral valve  regurgitation. No evidence of mitral stenosis.  4. The aortic valve is tricuspid. Aortic valve regurgitation is mild.  Mild to moderate aortic valve sclerosis/calcification is present, without  any evidence of aortic stenosis. Aortic regurgitation PHT measures 654  msec.  5. The inferior vena cava is normal in size with greater than 50%  respiratory variability, suggesting right atrial pressure of 3 mmHg.   NM Stress 10/11/2019  The left ventricular ejection fraction is normal (55-65%).  Nuclear stress EF: 65%.  The study is normal.  This is a low risk study.   Low risk stress nuclear study with normal perfusion and normal left ventricular regional and global systolic  function.  Zio 10/12/2019 1. No arrhythmias detected. No atrial fibrillation. 2. Occasional PACs (1% burden).     Recent Labs: 09/17/2019: TSH 0.086 09/19/2019: ALT 11; B Natriuretic Peptide 352.3; Magnesium 1.9 09/20/2019: BUN 14; Creatinine, Ser 1.19; Hemoglobin 10.3; Platelets 227; Potassium 4.4; Sodium 135   Recent Lipid Panel    Component Value Date/Time   CHOL 151 09/17/2019 0233   TRIG 63 09/17/2019 0233   HDL 41 09/17/2019 0233   CHOLHDL 3.7 09/17/2019 0233   VLDL 13 09/17/2019 0233   LDLCALC 97 09/17/2019 0233    Physical Exam:   VS:  BP (!) 142/64   Pulse (!) 50   Ht 5\' 4"  (1.626 m)   Wt 117 lb (53.1 kg)   BMI 20.08 kg/m    Wt Readings from Last 3 Encounters:  11/22/19 117 lb (53.1 kg)  10/11/19 115 lb (  52.2 kg)  09/28/19 115 lb 3.2 oz (52.3 kg)    General: Well nourished, well developed, in no acute distress Heart: Atraumatic, normal size  Eyes: PEERLA, EOMI  Neck: Supple, no JVD Endocrine: No thryomegaly Cardiac: Normal S1, S2; RRR; no murmurs, rubs, or gallops Lungs: Clear to auscultation bilaterally, no wheezing, rhonchi or rales  Abd: Soft, nontender, no hepatomegaly  Ext: No edema, pulses 2+ Musculoskeletal: No deformities, BUE and BLE strength normal and equal Skin: Warm and dry, no rashes   Neuro: Alert and oriented to person, place, time, and situation, CNII-XII grossly intact, no focal deficits  Psych: Normal mood and affect   ASSESSMENT:   Samanda Buske is a 86 y.o. female who presents for the following: 1. Chronic systolic heart failure (HCC)   2. Stress-induced cardiomyopathy   3. Essential hypertension   4. Mixed hyperlipidemia     PLAN:   1. Chronic systolic heart failure (HCC) 2. Stress-induced cardiomyopathy -Admitted in June of this year for TIA.  Echocardiogram with basal to mid akinesis.  EF was 35 to 40%.  Her most recent stress test shows no evidence of CAD.  Her ejection fraction was normal without wall motion  abnormalities.  Possibly this was a stress-induced cardiomyopathy.  She seems to be doing well and has no symptoms of heart failure.  She is euvolemic on exam.  She is taking Lasix 20 mg as needed.  I would recommend to continue this.  She has numerous allergies to beta-blockers ACE/ARB.  Given that she has advanced dementia and no real symptoms I see no need to treat this aggressively.  We will plan to take a conservative approach.  3. Essential hypertension -Well-controlled today.  No change in medications.  4. Mixed hyperlipidemia -She remains on lovastatin.  Disposition: Return in about 1 year (around 11/21/2020).  Medication Adjustments/Labs and Tests Ordered: Current medicines are reviewed at length with the patient today.  Concerns regarding medicines are outlined above.  No orders of the defined types were placed in this encounter.  No orders of the defined types were placed in this encounter.   Patient Instructions  Medication Instructions:  Your physician recommends that you continue on your current medications as directed. Please refer to the Current Medication list given to you today.  *If you need a refill on your cardiac medications before your next appointment, please call your pharmacy*  Follow-Up: At [facility], you and your health needs are our priority.  As part of our continuing mission to provide you with exceptional heart care, we have created designated Provider Care Teams.  These Care Teams include your primary Cardiologist (physician) and Advanced Practice Providers (APPs -  Physician Assistants and Nurse Practitioners) who all work together to provide you with the care you need, when you need it.  We recommend signing up for the patient portal called "MyChart".  Sign up information is provided on this After Visit Summary.  MyChart is used to connect with patients for Virtual Visits (Telemedicine).  Patients are able to view lab/test results, encounter notes,  upcoming appointments, etc.  Non-urgent messages can be sent to your provider as well.   To learn more about what you can do with MyChart, go to [email or web address].    Your next appointment:   12 month(s)  The format for your next appointment:   In Person  Provider:  Lennie Odor, MD        Time Spent with Patient: I have spent a total of  25 minutes with patient reviewing hospital notes, telemetry, EKGs, labs and examining the patient as well as establishing an assessment and plan that was discussed with the patient.  > 50% of time was spent in direct patient care.  Signed, Lenna Gilford. Flora Lipps, MD [facility]  [address] [place], [place] [address] [phone]  11/22/2019 8:29 AM

## 2019-11-20 NOTE — Telephone Encounter (Signed)
Attempted to call pt, LVM  

## 2019-11-22 ENCOUNTER — Ambulatory Visit: Payer: Medicare HMO | Admitting: Cardiovascular Disease

## 2019-11-22 ENCOUNTER — Encounter: Payer: Self-pay | Admitting: Cardiovascular Disease

## 2019-11-22 ENCOUNTER — Other Ambulatory Visit: Payer: Self-pay

## 2019-11-22 VITALS — BP 142/64 | HR 50 | Ht 64.0 in | Wt 117.0 lb

## 2019-11-22 DIAGNOSIS — I5022 Chronic systolic (congestive) heart failure: Secondary | ICD-10-CM

## 2019-11-22 DIAGNOSIS — I10 Essential (primary) hypertension: Secondary | ICD-10-CM | POA: Diagnosis not present

## 2019-11-22 DIAGNOSIS — E782 Mixed hyperlipidemia: Secondary | ICD-10-CM | POA: Diagnosis not present

## 2019-11-22 DIAGNOSIS — I5181 Takotsubo syndrome: Secondary | ICD-10-CM

## 2019-11-22 NOTE — Patient Instructions (Signed)
Medication Instructions:  Your physician recommends that you continue on your current medications as directed. Please refer to the Current Medication list given to you today.  *If you need a refill on your cardiac medications before your next appointment, please call your pharmacy*  Follow-Up: At [facility], you and your health needs are our priority.  As part of our continuing mission to provide you with exceptional heart care, we have created designated Provider Care Teams.  These Care Teams include your primary Cardiologist (physician) and Advanced Practice Providers (APPs -  Physician Assistants and Nurse Practitioners) who all work together to provide you with the care you need, when you need it.  We recommend signing up for the patient portal called "MyChart".  Sign up information is provided on this After Visit Summary.  MyChart is used to connect with patients for Virtual Visits (Telemedicine).  Patients are able to view lab/test results, encounter notes, upcoming appointments, etc.  Non-urgent messages can be sent to your provider as well.   To learn more about what you can do with MyChart, go to [email or web address].    Your next appointment:   12 month(s)  The format for your next appointment:   In Person  Provider:  Lennie Odor, MD

## 2019-12-13 ENCOUNTER — Other Ambulatory Visit: Payer: Self-pay

## 2019-12-13 ENCOUNTER — Ambulatory Visit: Payer: Medicare HMO | Admitting: Student

## 2019-12-13 ENCOUNTER — Encounter: Payer: Self-pay | Admitting: Student

## 2019-12-13 VITALS — BP 146/60 | HR 52 | Ht 64.0 in | Wt 116.4 lb

## 2019-12-13 DIAGNOSIS — I10 Essential (primary) hypertension: Secondary | ICD-10-CM | POA: Diagnosis not present

## 2019-12-13 DIAGNOSIS — I6310 Cerebral infarction due to embolism of unspecified precerebral artery: Secondary | ICD-10-CM

## 2019-12-13 DIAGNOSIS — I5022 Chronic systolic (congestive) heart failure: Secondary | ICD-10-CM

## 2019-12-13 DIAGNOSIS — I5181 Takotsubo syndrome: Secondary | ICD-10-CM | POA: Diagnosis not present

## 2019-12-13 NOTE — Patient Instructions (Addendum)
Medication Instructions:  *If you need a refill on your cardiac medications before your next appointment, please call your pharmacy*  Testing/Procedures: Your physician has recommended that you have a Linq ICM (Loop recorder) inserted. This LINQ Insertable Cardiac Monitor is an implantable patient-activated and automatically activated monitoring system that records subcutaneous ECG and is indicated in the following cases: Patients with clinical syndromes or situations at increased risk of cardiac arrhythmias  Patients who experience transient symptoms such as dizziness, palpitation, syncope, and chest pain, which may suggest a cardiac arrhythmia  Follow-Up: At [facility], you and your health needs are our priority.  As part of our continuing mission to provide you with exceptional heart care, we have created designated Provider Care Teams.  These Care Teams include your primary Cardiologist (physician) and Advanced Practice Providers (APPs -  Physician Assistants and Nurse Practitioners) who all work together to provide you with the care you need, when you need it.  We recommend signing up for the patient portal called "MyChart".  Sign up information is provided on this After Visit Summary.  MyChart is used to connect with patients for Virtual Visits (Telemedicine).  Patients are able to view lab/test results, encounter notes, upcoming appointments, etc.  Non-urgent messages can be sent to your provider as well.   To learn more about what you can do with MyChart, go to [email or web address].    Your next appointment:   Your physician recommends that you schedule a follow-up appointment in: Monday 12/25/19 at 9:30 am for loop implant with Dr. Lalla Brothers  The format for your next appointment:   In Person with Dr. Steffanie Dunn

## 2019-12-13 NOTE — Progress Notes (Signed)
PCP:  Lester [place]., MD Primary Cardiologist: Dr. Flora Lipps Electrophysiologist: New   Andrea Warner is a 86 y.o. female seen today for Dr. Elberta Fortis for review of monitor and discussion of possible loop recorder. .  Since discharge from hospital the patient reports doing well. She has been able to move back into her home, and stress testing demonstrated normalization of her ejection fraction. She has baseline dementia, but lives and cares for herself independently. Her son is present today and is her POA. she denies chest pain, palpitations, dyspnea, PND, orthopnea, nausea, vomiting, dizziness, syncope, edema, weight gain, or early satiety.  Past Medical History:  Diagnosis Date  . Alzheimer's dementia (HCC)   . Coronary artery disease   . Depression   . GERD (gastroesophageal reflux disease)   . Hypertension   . Thyroid disease    Past Surgical History:  Procedure Laterality Date  . ABDOMINAL HYSTERECTOMY    . FRACTURE SURGERY      Current Outpatient Medications  Medication Sig Dispense Refill  . aspirin EC 81 MG EC tablet Take 1 tablet (81 mg total) by mouth daily. Swallow whole. 30 tablet 11  . furosemide (LASIX) 20 MG tablet Take 20 mg by mouth daily as needed.    . hydrALAZINE (APRESOLINE) 25 MG tablet Take by mouth.    . isosorbide mononitrate (IMDUR) 30 MG 24 hr tablet Take 0.5 tablets (15 mg total) by mouth daily. 90 tablet 1  . lovastatin (MEVACOR) 20 MG tablet Take 20 mg by mouth daily.     . memantine (NAMENDA) 10 MG tablet Take by mouth.    . vitamin B-12 1000 MCG tablet Take 1 tablet (1,000 mcg total) by mouth daily. 30 tablet 0   No current facility-administered medications for this visit.    Allergies  Allergen Reactions  . Amlodipine Swelling and Rash  . Codeine Swelling and Rash  . Cyclobenzaprine Hives  . Iodinated Diagnostic Agents Anaphylaxis, Shortness Of Breath, Swelling and Other (See Comments)    "Swelling around the throat"  . Iodine  Shortness Of Breath, Swelling and Rash  . Shellfish-Derived Products Anaphylaxis, Shortness Of Breath, Swelling and Other (See Comments)    "Swelling around the throat"  . Actonel [Risedronate Sodium] Other (See Comments)    Reaction not recalled, but "I have a lot of allergies"  . Valsartan Swelling  . Atorvastatin Rash  . Losartan Potassium-Hctz Swelling and Rash  . Metoprolol Rash  . Penicillins Rash  . Pravastatin Itching, Swelling and Rash        . Risedronate Rash  . Singulair [Montelukast Sodium] Rash  . Sulfa Antibiotics Rash  . Zafirlukast Rash    Social History   Socioeconomic History  . Marital status: Married    Spouse name: Not on file  . Number of children: Not on file  . Years of education: Not on file  . Highest education level: Not on file  Occupational History  . Occupation: retired  Tobacco Use  . Smoking status: Never Smoker  . Smokeless tobacco: Never Used  Substance and Sexual Activity  . Alcohol use: No  . Drug use: Not on file  . Sexual activity: Not on file  Other Topics Concern  . Not on file  Social History Narrative  . Not on file   Social Determinants of Health   Financial Resource Strain:   . Difficulty of Paying Living Expenses: Not on file  Food Insecurity:   . Worried About Programme researcher, broadcasting/film/video  in the Last Year: Not on file  . Ran Out of Food in the Last Year: Not on file  Transportation Needs:   . Lack of Transportation (Medical): Not on file  . Lack of Transportation (Non-Medical): Not on file  Physical Activity:   . Days of Exercise per Week: Not on file  . Minutes of Exercise per Session: Not on file  Stress:   . Feeling of Stress : Not on file  Social Connections:   . Frequency of Communication with Friends and Family: Not on file  . Frequency of Social Gatherings with Friends and Family: Not on file  . Attends Religious Services: Not on file  . Active Member of Clubs or Organizations: Not on file  . Attends Tax inspector Meetings: Not on file  . Marital Status: Not on file  Intimate Partner Violence:   . Fear of Current or Ex-Partner: Not on file  . Emotionally Abused: Not on file  . Physically Abused: Not on file  . Sexually Abused: Not on file     Review of Systems: General: No chills, fever, night sweats or weight changes  Cardiovascular:  No chest pain, dyspnea on exertion, edema, orthopnea, palpitations, paroxysmal nocturnal dyspnea Dermatological: No rash, lesions or masses Respiratory: No cough, dyspnea Urologic: No hematuria, dysuria Abdominal: No nausea, vomiting, diarrhea, bright red blood per rectum, melena, or hematemesis Neurologic: No visual changes, weakness, changes in mental status All other systems reviewed and are otherwise negative except as noted above.  Physical Exam: Vitals:   12/13/19 0943  BP: (!) 146/60  Pulse: (!) 52  SpO2: 98%  Weight: 116 lb 6.4 oz (52.8 kg)  Height: 5\' 4"  (1.626 m)    GEN- The patient is well appearing, alert and oriented x 3 today.   HEENT: normocephalic, atraumatic; sclera clear, conjunctiva pink; hearing intact; oropharynx clear; neck supple, no JVP Lymph- no cervical lymphadenopathy Lungs- Clear to ausculation bilaterally, normal work of breathing.  No wheezes, rales, rhonchi Heart- Regular rate and rhythm, no murmurs, rubs or gallops, PMI not laterally displaced GI- soft, non-tender, non-distended, bowel sounds present, no hepatosplenomegaly Extremities- no clubbing, cyanosis, or edema; DP/PT/radial pulses 2+ bilaterally MS- no significant deformity or atrophy Skin- warm and dry, no rash or lesion Psych- euthymic mood, full affect Neuro- strength and sensation are intact  EKG is not ordered. EKG 09/28/2019 showed sinus bradycardia at 49 bpm. Personally reviewed.  Previous EKGs did NOT show atrial fibrillation.   Additional studies reviewed include: Previous office notes, Gen cards notes, admission notes, recent echo, recent  myoview  Assessment and Plan:  1. TIA Pt wore 30 day monitor that showed sinus rhythm with seldom PACs and no atrial fibrillation  The patient has had a cryptogenic stroke. I spoke at length with the patient and her son about monitoring for afib with an implantable loop recorder.  Risks, benefits, and alteratives to implantable loop recorder were discussed with the patient today.   At this time, the patient is very clear in their decision to proceed with implantable loop recorder. I will schedule her for the next available appointment with Dr. 11/29/2019 per the patient and son request as he is likely to have the earliest availability.   2. Chronic systolic CHF / ? Stress-induced CMP Echo during admission for TIA showed 35-40%. Most recent stress test showed no evidence of CAD, and normalization of EF without WMA.  Dr. Lalla Brothers following conservatively in setting of dementia and significant sensitivity to medications  3. Essential HTN Continue current medications  4. HLD Continue statin per gen cards/PCP  Graciella Freer, PA-C  12/13/19 10:22 AM

## 2019-12-24 NOTE — Progress Notes (Deleted)
Electrophysiology Office Note:    Date:  12/24/2019   ID:  Andrea Warner, Andrea Warner [birth year], MRN [id number]  PCP:  Lester Chanute., MD  [facility] Cardiologist:  No primary care provider on file.  [facility] Electrophysiologist:  None   Referring MD: Lester Bazine., MD   Chief Complaint: Stroke, rule out AF  History of Present Illness:    Andrea Warner is a 86 y.o. female who presents for an evaluation of cryptogenic stroke at the request of Dr Flora Lipps. Their medical history includes cryptogenic stroke, alzheimer's dementia, CAD, depression, GERD, HTN and thyroid disease. She was admitted in June for a TIA and was found during that admission to have a depressed ejection fraction thought to be secondary to Takotsubo.   Past Medical History:  Diagnosis Date   Alzheimer's dementia (HCC)    Coronary artery disease    Depression    GERD (gastroesophageal reflux disease)    Hypertension    Thyroid disease     Past Surgical History:  Procedure Laterality Date   ABDOMINAL HYSTERECTOMY     FRACTURE SURGERY      Current Medications: No outpatient medications have been marked as taking for the 12/25/19 encounter (Appointment) with Lanier Prude, MD.     Allergies:   Amlodipine, Codeine, Cyclobenzaprine, Iodinated diagnostic agents, Iodine, Shellfish-derived products, Actonel [risedronate sodium], Valsartan, Atorvastatin, Losartan potassium-hctz, Metoprolol, Penicillins, Pravastatin, Risedronate, Singulair [montelukast sodium], Sulfa antibiotics, and Zafirlukast   Social History   Socioeconomic History   Marital status: Married    Spouse name: Not on file   Number of children: Not on file   Years of education: Not on file   Highest education level: Not on file  Occupational History   Occupation: retired  Tobacco Use   Smoking status: Never Smoker   Smokeless tobacco: Never Used  Substance and Sexual Activity   Alcohol use: No   Drug  use: Not on file   Sexual activity: Not on file  Other Topics Concern   Not on file  Social History Narrative   Not on file   Social Determinants of Health   Financial Resource Strain:    Difficulty of Paying Living Expenses: Not on file  Food Insecurity:    Worried About Programme researcher, broadcasting/film/video in the Last Year: Not on file   [employer] of Food in the Last Year: Not on file  Transportation Needs:    Lack of Transportation (Medical): Not on file   Lack of Transportation (Non-Medical): Not on file  Physical Activity:    Days of Exercise per Week: Not on file   Minutes of Exercise per Session: Not on file  Stress:    Feeling of Stress : Not on file  Social Connections:    Frequency of Communication with Friends and Family: Not on file   Frequency of Social Gatherings with Friends and Family: Not on file   Attends Religious Services: Not on file   Active Member of Clubs or Organizations: Not on file   Attends Banker Meetings: Not on file   Marital Status: Not on file     Family History: The patient's family history includes Hypertension in her father.  ROS:   Please see the history of present illness.    All other systems reviewed and are negative.  EKGs/Labs/Other Studies Reviewed:    The following studies were reviewed today: Echo, ECG  09/17/2019 Echo personally reviewed EF35%, RV normal  09/28/2019 ECG personally  reviewed Sinus bradycardia w low voltage  EKG:  The ekg ordered today demonstrates ***  Recent Labs: 09/17/2019: TSH 0.086 09/19/2019: ALT 11; B Natriuretic Peptide 352.3; Magnesium 1.9 09/20/2019: BUN 14; Creatinine, Ser 1.19; Hemoglobin 10.3; Platelets 227; Potassium 4.4; Sodium 135  Recent Lipid Panel    Component Value Date/Time   CHOL 151 09/17/2019 0233   TRIG 63 09/17/2019 0233   HDL 41 09/17/2019 0233   CHOLHDL 3.7 09/17/2019 0233   VLDL 13 09/17/2019 0233   LDLCALC 97 09/17/2019 0233    Physical Exam:    VS:   There were no vitals taken for this visit.    Wt Readings from Last 3 Encounters:  12/13/19 116 lb 6.4 oz (52.8 kg)  11/22/19 117 lb (53.1 kg)  10/11/19 115 lb (52.2 kg)     GEN: *** Well nourished, well developed in no acute distress HEENT: Normal NECK: No JVD; No carotid bruits LYMPHATICS: No lymphadenopathy CARDIAC: ***RRR, no murmurs, rubs, gallops RESPIRATORY:  Clear to auscultation without rales, wheezing or rhonchi  ABDOMEN: Soft, non-tender, non-distended MUSCULOSKELETAL:  No edema; No deformity  SKIN: Warm and dry NEUROLOGIC:  Alert and oriented x 3 PSYCHIATRIC:  Normal affect   ASSESSMENT:    1. Chronic systolic heart failure (HCC)   2. 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 Essential hypertension    PLAN:    In order of problems listed above:  1. Chronic systolic heart failure  2. Cerebral Infarction  3. HTN   Medication Adjustments/Labs and Tests Ordered: Current medicines are reviewed at length with the patient today.  Concerns regarding medicines are outlined above.  No orders of the defined types were placed in this encounter.  No orders of the defined types were placed in this encounter.    Signed, Steffanie Dunn, MD, [facility]  12/24/2019 8:13 PM    Electrophysiology [facility]        SURGEON:  Steffanie Dunn, MD    PREPROCEDURE DIAGNOSIS:  TIA    POSTPROCEDURE DIAGNOSIS:  TIA     PROCEDURES:   1. Implantable loop recorder implantation    INTRODUCTION:  Andrea Warner is a 86 y.o. female with a history of TIA who presents today for implantable loop implantation. She has work a heart monitor in the past without detection of any atrial fibrillation    DESCRIPTION OF PROCEDURE:  Informed written consent was obtained.  The patient required no sedation for the procedure today.  Mapping over the patient's chest was performed to identify the area where electrograms were most prominent  for ILR recording.  This area was found to be the left parasternal region over the 3rd-4th intercostal space. The patients left chest was therefore prepped and draped in the usual sterile fashion. The skin overlying the left parasternal region was infiltrated with lidocaine for local analgesia.  A 0.5-cm incision was made over the left parasternal region over the 3rd intercostal space.  A subcutaneous ILR pocket was fashioned using a combination of sharp and blunt dissection.  A Medtronic Reveal Linq model [id number] implantable loop recorder was then placed into the pocket  R waves were very prominent and measured >0.37mV.  Steri- Strips and a sterile dressing were then applied.  There were no early apparent complications.     CONCLUSIONS:   1. Successful implantation of a Medtronic Reveal LINQ implantable loop recorder for detection of AF  2. No early apparent complications.   Steffanie Dunn,  MD 12/24/2019 8:14 PM

## 2019-12-25 ENCOUNTER — Ambulatory Visit: Payer: Medicare HMO | Admitting: Cardiology

## 2020-09-14 ENCOUNTER — Other Ambulatory Visit: Payer: Self-pay | Admitting: Cardiovascular Disease

## 2021-05-08 IMAGING — MR MR MRA HEAD W/O CM
1 series · 19 of 48 positions shown · non-contrast
Comparison: None.

CLINICAL DATA: Recurrent syncope

EXAM:
MRI HEAD WITHOUT CONTRAST
MRA HEAD WITHOUT CONTRAST
TECHNIQUE: Multiplanar, multiecho pulse sequences of the brain and surrounding
structures were obtained without intravenous contrast. Angiographic
images of the head were obtained using MRA technique without
contrast.

[Series 2: ax (id) · axial · 1.0mm · 0.43mm/px · z∈[-68,+17]mm · 19 of 184 slices shown]
[im 1/184]
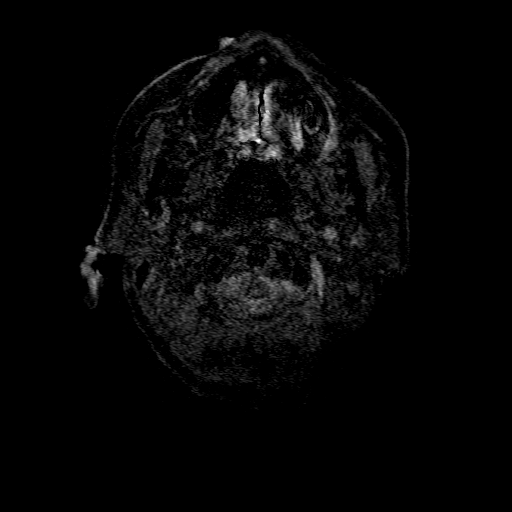
[im 4/184]
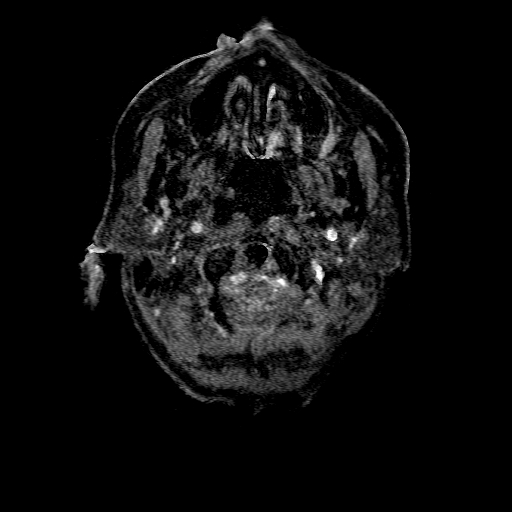
[im 8/184]
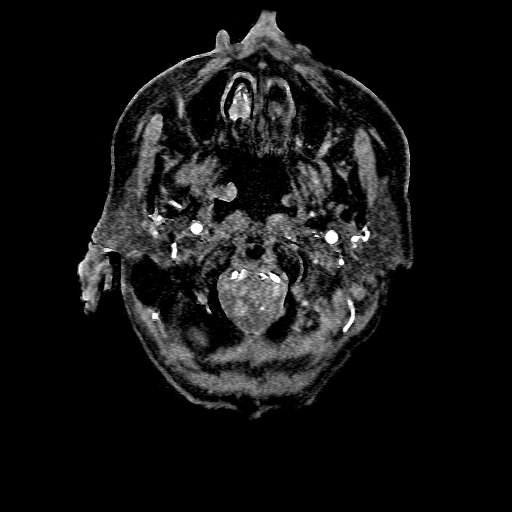
[im 12/184]
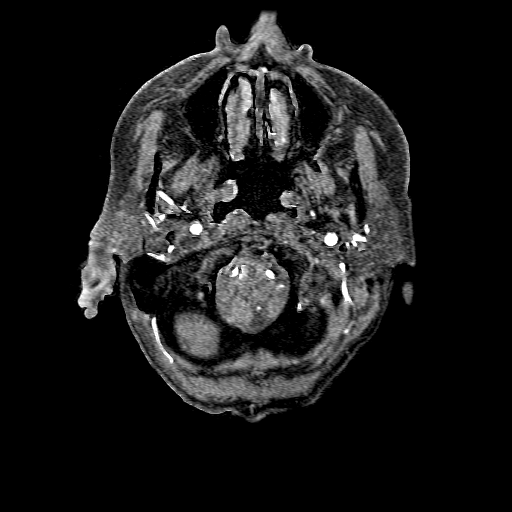
[im 16/184]
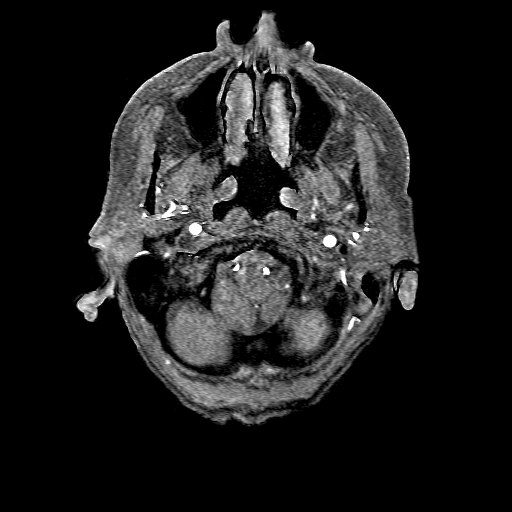
[im 20/184]
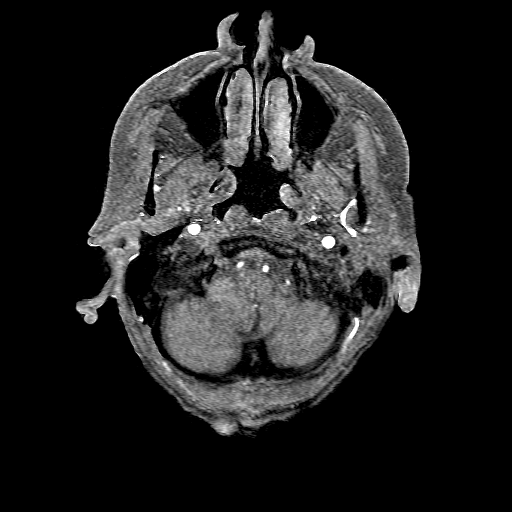
[im 24/184]
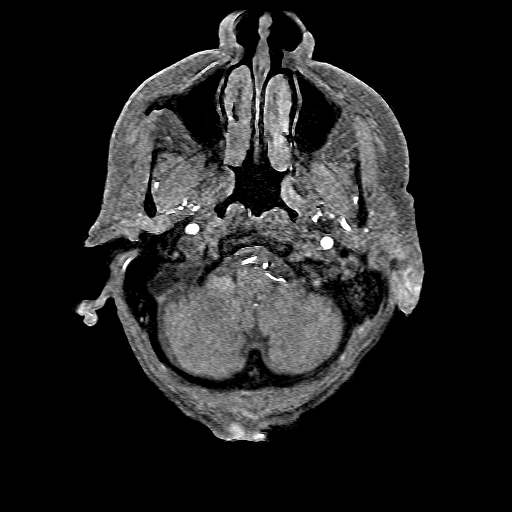
[im 28/184]
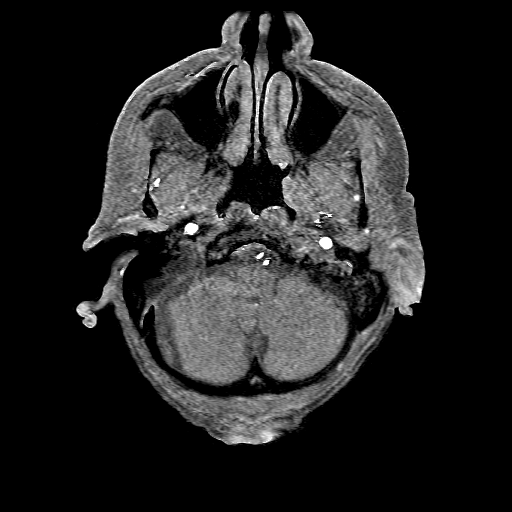
[im 32/184]
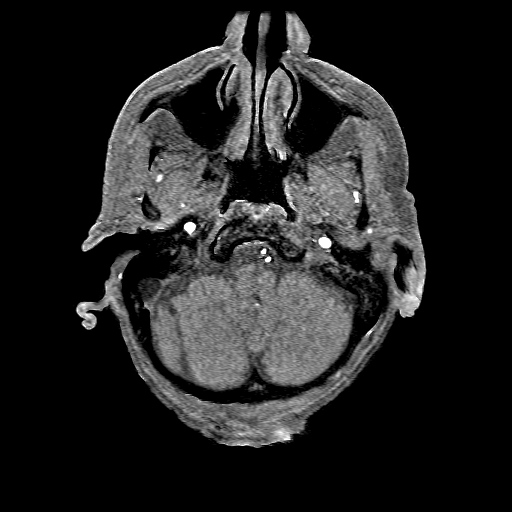
[im 36/184]
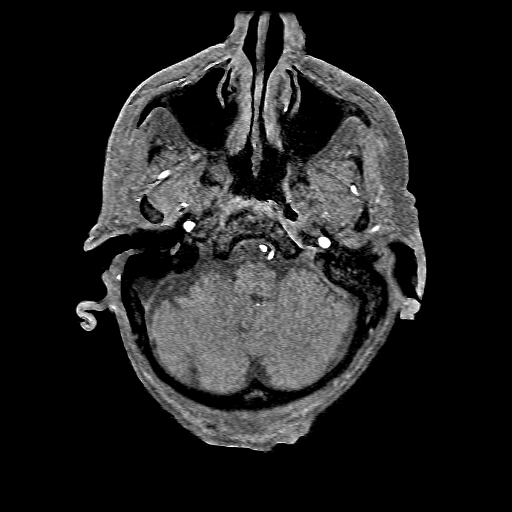
[im 39/184]
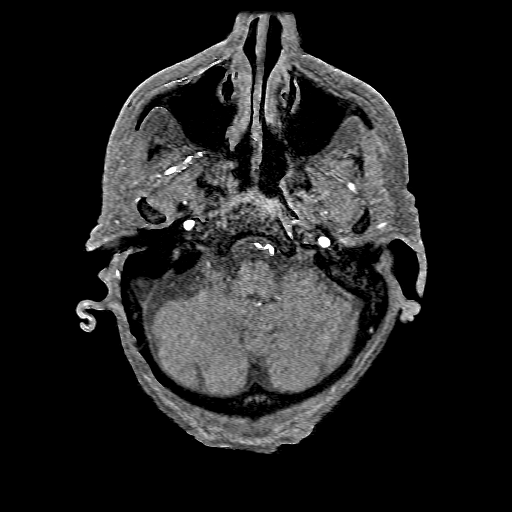
[im 59/184]
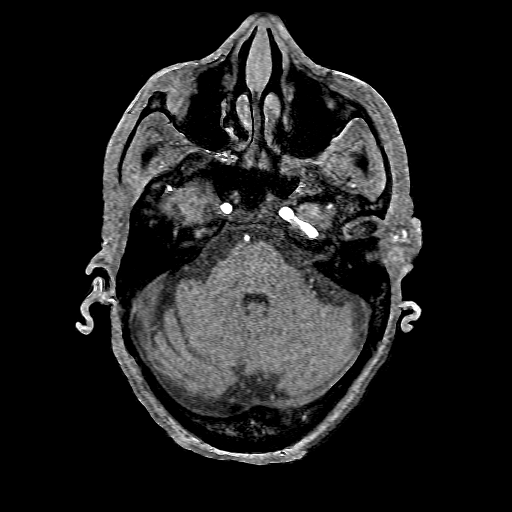
[im 82/184]
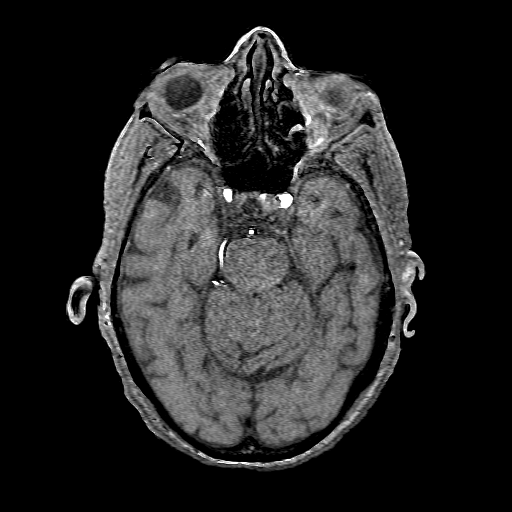
[im 94/184]
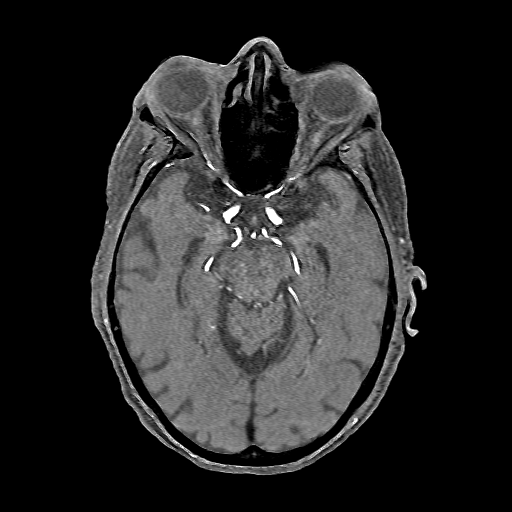
[im 106/184]
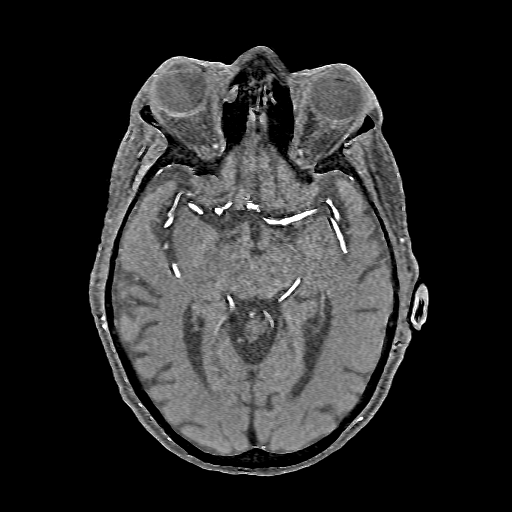
[im 129/184]
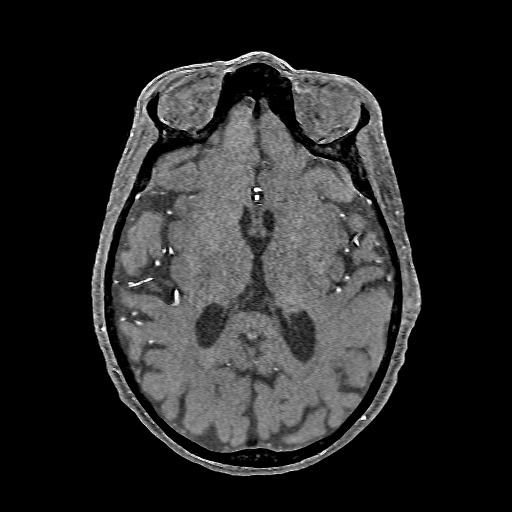
[im 152/184]
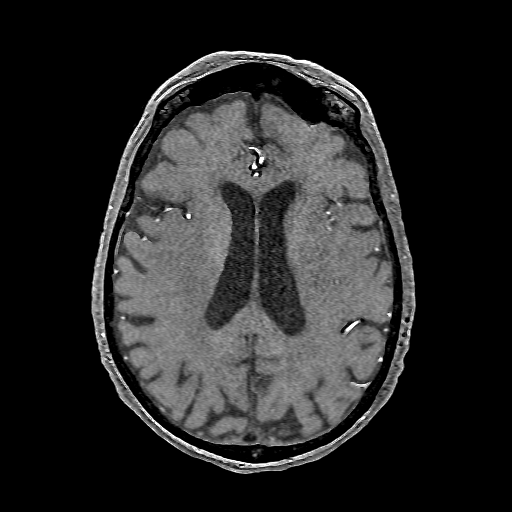
[im 156/184]
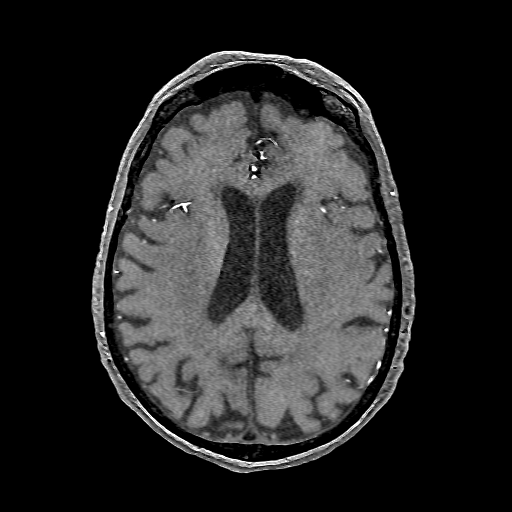
[im 176/184]
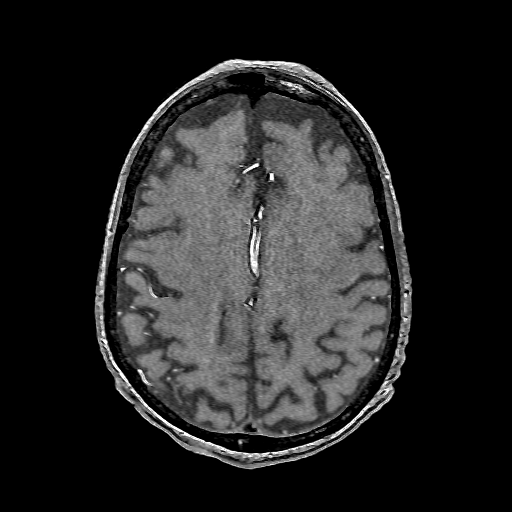

[19 of 48 positions shown; findings below may reference images not displayed]

FINDINGS: MRI HEAD FINDINGS

BRAIN: No acute infarct, acute hemorrhage or extra-axial collection.
Early confluent hyperintense T2-weighted signal of the
periventricular and deep white matter, most commonly due to chronic
ischemic microangiopathy. There is generalized atrophy without lobar
predilection. No chronic microhemorrhage. Normal midline structures.

VASCULAR: Major flow voids are preserved.

SKULL AND UPPER CERVICAL SPINE: Normal calvarium and skull base.
Visualized upper cervical spine and soft tissues are normal.

SINUSES/ORBITS: No paranasal sinus fluid levels or advanced mucosal
thickening. No mastoid or middle ear effusion. Normal orbits.

MRA HEAD FINDINGS

POSTERIOR CIRCULATION:

--Vertebral arteries: Normal V4 segments.

--Inferior cerebellar arteries: Normal.

--Basilar artery: Normal.

--Superior cerebellar arteries: Normal.

--Posterior cerebral arteries: Normal. Both are predominantly
supplied by the posterior communicating arteries (p-comm).

ANTERIOR CIRCULATION:

--Intracranial internal carotid arteries: Normal.

--Anterior cerebral arteries (ACA): Normal. Both A1 segments are
present. Patent anterior communicating artery (a-comm).

--Middle cerebral arteries (MCA): Normal.
IMPRESSION: 1. No acute intracranial abnormality.
2. Generalized atrophy and chronic ischemic microangiopathy.
3. Normal intracranial MRA.

## 2023-03-31 DEATH — deceased
